# Patient Record
Sex: Female | Born: 1990 | Race: Black or African American | Hispanic: No | Marital: Single | Smoking: Never smoker
Health system: Southern US, Community
[De-identification: ages and names within clinical notes are randomized; demographics above are authoritative.]

## PROBLEM LIST (undated history)

## (undated) ENCOUNTER — Inpatient Hospital Stay (HOSPITAL_COMMUNITY): Payer: Self-pay

## (undated) DIAGNOSIS — K219 Gastro-esophageal reflux disease without esophagitis: Secondary | ICD-10-CM

## (undated) HISTORY — PX: HERNIA REPAIR: SHX51

---
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
## 2012-01-05 DIAGNOSIS — J382 Nodules of vocal cords: Secondary | ICD-10-CM | POA: Insufficient documentation

## 2012-01-31 ENCOUNTER — Encounter (HOSPITAL_COMMUNITY): Payer: Self-pay | Admitting: *Deleted

## 2012-01-31 ENCOUNTER — Emergency Department (HOSPITAL_COMMUNITY)
Admission: EM | Admit: 2012-01-31 | Discharge: 2012-01-31 | Disposition: A | Discharge status: Home / Self Care | Payer: BC Managed Care – PPO | Attending: Emergency Medicine | Admitting: Emergency Medicine

## 2012-01-31 DIAGNOSIS — R109 Unspecified abdominal pain: Secondary | ICD-10-CM | POA: Insufficient documentation

## 2012-01-31 DIAGNOSIS — R197 Diarrhea, unspecified: Secondary | ICD-10-CM | POA: Insufficient documentation

## 2012-01-31 DIAGNOSIS — R112 Nausea with vomiting, unspecified: Secondary | ICD-10-CM | POA: Insufficient documentation

## 2012-01-31 LAB — POCT I-STAT, CHEM 8
Calcium, Ion: 1.14 mmol/L (ref 1.12–1.32)
Chloride: 108 mEq/L (ref 96–112)
Creatinine, Ser: 1 mg/dL (ref 0.50–1.10)
Glucose, Bld: 124 mg/dL — ABNORMAL HIGH (ref 70–99)
HCT: 45 % (ref 36.0–46.0)
Hemoglobin: 15.3 g/dL — ABNORMAL HIGH (ref 12.0–15.0)

## 2012-01-31 LAB — URINE MICROSCOPIC-ADD ON

## 2012-01-31 LAB — URINALYSIS, ROUTINE W REFLEX MICROSCOPIC
Bilirubin Urine: NEGATIVE
Ketones, ur: NEGATIVE mg/dL
Nitrite: NEGATIVE
Urobilinogen, UA: 0.2 mg/dL (ref 0.0–1.0)

## 2012-01-31 LAB — PREGNANCY, URINE: Preg Test, Ur: NEGATIVE

## 2012-01-31 MED ORDER — SODIUM CHLORIDE 0.9 % IV SOLN
1000.0000 mL | Freq: Once | INTRAVENOUS | Status: AC
  Administered 2012-01-31: 1000 mL via INTRAVENOUS

## 2012-01-31 MED ORDER — ONDANSETRON HCL 4 MG PO TABS: 4.0000 mg | ORAL_TABLET | Freq: Four times a day (QID) | ORAL | Status: AC

## 2012-01-31 MED ORDER — ONDANSETRON HCL 4 MG/2ML IJ SOLN
4.0000 mg | Freq: Once | INTRAMUSCULAR | Status: AC
  Administered 2012-01-31: 4 mg via INTRAVENOUS
  Filled 2012-01-31: qty 2

## 2012-01-31 MED ORDER — SODIUM CHLORIDE 0.9 % IV BOLUS (SEPSIS)
1000.0000 mL | Freq: Once | INTRAVENOUS | Status: AC
  Administered 2012-01-31: 1000 mL via INTRAVENOUS

## 2012-01-31 NOTE — ED Notes (Signed)
Bed:WA01<BR> Expected date:<BR> Expected time:<BR> Means of arrival:<BR> Comments:<BR> closed

## 2012-01-31 NOTE — ED Provider Notes (Signed)
History     CSN: [id number]  Arrival date & time 01/31/12  0610   First MD Initiated Contact with Patient 01/31/12 [phone]      Chief Complaint  Patient presents with  . Nausea  . Emesis  . Diarrhea    (Consider location/radiation/quality/duration/timing/severity/associated sxs/prior treatment) Patient is a 21 y.o. female presenting with vomiting and diarrhea. The history is provided by the patient.  Emesis  This is a new problem. The current episode started yesterday. The problem occurs 5 to 10 times per day. The problem has not changed since onset.There has been no fever. Associated symptoms include abdominal pain and diarrhea. Pertinent negatives include no chills and no fever. Associated symptoms comments: She started having frequent vomiting with less frequent diarrhea yesterday around 1:00 p.m. She complains of abdominal cramping after vomiting but no other abdominal pain. . Risk factors include ill contacts.  Diarrhea The primary symptoms include abdominal pain, vomiting and diarrhea. Primary symptoms do not include fever or dysuria.  The illness does not include chills.    History reviewed. No pertinent past medical history.  History reviewed. No pertinent past surgical history.  History reviewed. No pertinent family history.  History  Substance Use Topics  . Smoking status: Never Smoker   . Smokeless tobacco: Not on file  . Alcohol Use: No    OB History    Grav Para Term Preterm Abortions TAB SAB Ect Mult Living                  Review of Systems  Constitutional: Negative for fever and chills.  HENT: Negative.   Respiratory: Negative.   Cardiovascular: Negative.   Gastrointestinal: Positive for vomiting, abdominal pain and diarrhea.  Genitourinary: Negative for dysuria and vaginal discharge.  Musculoskeletal: Negative.   Skin: Negative.   Neurological: Negative.     Allergies  Review of patient's allergies indicates no known allergies.  Home  Medications   Current Outpatient Rx  Name Route Sig Dispense Refill  . PANTOPRAZOLE SODIUM 40 MG PO TBEC Oral Take 40 mg by mouth 2 (two) times daily.      BP 99/60  Pulse 132  Temp(Src) 98.3 F (36.8 C) (Oral)  Resp 20  SpO2 99%  LMP 01/09/2012  Physical Exam  Constitutional: She appears well-developed and well-nourished.  HENT:  Head: Normocephalic.  Mouth/Throat: Mucous membranes are dry.  Neck: Normal range of motion. Neck supple.  Cardiovascular: Normal rate and regular rhythm.   Pulmonary/Chest: Effort normal and breath sounds normal.  Abdominal: Soft. Bowel sounds are normal. There is no tenderness. There is no rebound and no guarding.  Musculoskeletal: Normal range of motion.  Neurological: She is alert. No cranial nerve deficit.  Skin: Skin is warm and dry. No rash noted.  Psychiatric: She has a normal mood and affect.    ED Course  Procedures (including critical care time)  Labs Reviewed  POCT I-STAT, CHEM 8 - Abnormal; Notable for the following:    Glucose, Bld 124 (*)    Hemoglobin 15.3 (*)    All other components within normal limits  PREGNANCY, URINE  URINALYSIS, ROUTINE W REFLEX MICROSCOPIC   No results found.   No diagnosis found.    MDM  Tolerating PO fluids. VS improved. Patient received 2 liters fluids and feels better. Patient and family ready for discharge home.        Rodena Medin, PA-C 01/31/12 [phone]

## 2012-01-31 NOTE — Discharge Instructions (Signed)
B.R.A.T. DIET FOR THE NEXT 12 HOURS THEN ADVANCE AS TOLERATED. PUSH FLUIDS IN SMALL AMOUNTS AND USE ZOFRAN FOR NAUSEA AS NEEDED. RETURN HERE WITH ANY HIGH FEVER, SEVERE PAIN, UNCONTROLLED VOMITING OR OTHER CONCERN.   B.R.A.T. Diet Your doctor has recommended the B.R.A.T. diet for you or your child until the condition improves. This is often used to help control diarrhea and vomiting symptoms. If you or your child can tolerate clear liquids, you may have:  Bananas.   Rice.   Applesauce.   Toast (and other simple starches such as crackers, potatoes, noodles).  Be sure to avoid dairy products, meats, and fatty foods until symptoms are better. Fruit juices such as apple, grape, and prune juice can make diarrhea worse. Avoid these. Continue this diet for 2 days or as instructed by your caregiver. Document Released: 11/08/2005 Document Revised: 10/28/2011 Document Reviewed: 04/27/2007 [facility] Patient Information 2012 [place], [place].  Viral Gastroenteritis Viral gastroenteritis is also known as stomach flu. This condition affects the stomach and intestinal tract. It can cause sudden diarrhea and vomiting. The illness typically lasts 3 to 8 days. Most people develop an immune response that eventually gets rid of the virus. While this natural response develops, the virus can make you quite ill. CAUSES  Many different viruses can cause gastroenteritis, such as rotavirus or noroviruses. You can catch one of these viruses by consuming contaminated food or water. You may also catch a virus by sharing utensils or other personal items with an infected person or by touching a contaminated surface. SYMPTOMS  The most common symptoms are diarrhea and vomiting. These problems can cause a severe loss of body fluids (dehydration) and a body salt (electrolyte) imbalance. Other symptoms may include:  Fever.   Headache.   Fatigue.   Abdominal pain.  DIAGNOSIS  Your caregiver can usually diagnose viral  gastroenteritis based on your symptoms and a physical exam. A stool sample may also be taken to test for the presence of viruses or other infections. TREATMENT  This illness typically goes away on its own. Treatments are aimed at rehydration. The most serious cases of viral gastroenteritis involve vomiting so severely that you are not able to keep fluids down. In these cases, fluids must be given through an intravenous line (IV). HOME CARE INSTRUCTIONS   Drink enough fluids to keep your urine clear or pale yellow. Drink small amounts of fluids frequently and increase the amounts as tolerated.   Ask your caregiver for specific rehydration instructions.   Avoid:   Foods high in sugar.   Alcohol.   Carbonated drinks.   Tobacco.   Juice.   Caffeine drinks.   Extremely hot or cold fluids.   Fatty, greasy foods.   Too much intake of anything at one time.   Dairy products until 24 to 48 hours after diarrhea stops.   You may consume probiotics. Probiotics are active cultures of beneficial bacteria. They may lessen the amount and number of diarrheal stools in adults. Probiotics can be found in yogurt with active cultures and in supplements.   Wash your hands well to avoid spreading the virus.   Only take over-the-counter or prescription medicines for pain, discomfort, or fever as directed by your caregiver. Do not give aspirin to children. Antidiarrheal medicines are not recommended.   Ask your caregiver if you should continue to take your regular prescribed and over-the-counter medicines.   Keep all follow-up appointments as directed by your caregiver.  SEEK IMMEDIATE MEDICAL CARE IF:  You are unable to keep fluids down.   You do not urinate at least once every 6 to 8 hours.   You develop shortness of breath.   You notice blood in your stool or vomit. This may look like coffee grounds.   You have abdominal pain that increases or is concentrated in one small area  (localized).   You have persistent vomiting or diarrhea.   You have a fever.   The patient is a child younger than 3 months, and he or she has a fever.   The patient is a child older than 3 months, and he or she has a fever and persistent symptoms.   The patient is a child older than 3 months, and he or she has a fever and symptoms suddenly get worse.   The patient is a baby, and he or she has no tears when crying.  MAKE SURE YOU:   Understand these instructions.   Will watch your condition.   Will get help right away if you are not doing well or get worse.  Document Released: 11/08/2005 Document Revised: 10/28/2011 Document Reviewed: 08/25/2011 [facility] Patient Information 2012 [place], [place].

## 2012-01-31 NOTE — ED Notes (Signed)
Pt c/o n/v/d since 1300 yesterday too many times to count.

## 2012-01-31 NOTE — ED Notes (Signed)
Pt ambulated to restroom to obtain urine specimen independently.

## 2012-02-01 NOTE — ED Provider Notes (Signed)
Medical screening examination/treatment/procedure(s) were performed by non-physician practitioner and as supervising physician I was immediately available for consultation/collaboration.   Vida Roller, MD 02/01/12 [phone]

## 2013-07-07 ENCOUNTER — Inpatient Hospital Stay (HOSPITAL_COMMUNITY)
Admission: AD | Admit: 2013-07-07 | Discharge: 2013-07-07 | Disposition: A | Discharge status: Home / Self Care | Payer: BC Managed Care – PPO | Source: Ambulatory Visit | Attending: Obstetrics & Gynecology | Admitting: Obstetrics & Gynecology

## 2013-07-07 ENCOUNTER — Encounter (HOSPITAL_COMMUNITY): Payer: Self-pay | Admitting: *Deleted

## 2013-07-07 DIAGNOSIS — N921 Excessive and frequent menstruation with irregular cycle: Secondary | ICD-10-CM

## 2013-07-07 DIAGNOSIS — Z3202 Encounter for pregnancy test, result negative: Secondary | ICD-10-CM | POA: Insufficient documentation

## 2013-07-07 DIAGNOSIS — N760 Acute vaginitis: Secondary | ICD-10-CM

## 2013-07-07 DIAGNOSIS — N938 Other specified abnormal uterine and vaginal bleeding: Secondary | ICD-10-CM | POA: Insufficient documentation

## 2013-07-07 DIAGNOSIS — N949 Unspecified condition associated with female genital organs and menstrual cycle: Secondary | ICD-10-CM | POA: Insufficient documentation

## 2013-07-07 DIAGNOSIS — R109 Unspecified abdominal pain: Secondary | ICD-10-CM | POA: Insufficient documentation

## 2013-07-07 DIAGNOSIS — A499 Bacterial infection, unspecified: Secondary | ICD-10-CM

## 2013-07-07 DIAGNOSIS — B9689 Other specified bacterial agents as the cause of diseases classified elsewhere: Secondary | ICD-10-CM | POA: Insufficient documentation

## 2013-07-07 HISTORY — DX: Gastro-esophageal reflux disease without esophagitis: K21.9

## 2013-07-07 LAB — WET PREP, GENITAL
Trich, Wet Prep: NONE SEEN
Yeast Wet Prep HPF POC: NONE SEEN

## 2013-07-07 LAB — URINALYSIS, ROUTINE W REFLEX MICROSCOPIC
Glucose, UA: NEGATIVE mg/dL
Leukocytes, UA: NEGATIVE
Nitrite: NEGATIVE
Protein, ur: NEGATIVE mg/dL

## 2013-07-07 LAB — URINE MICROSCOPIC-ADD ON

## 2013-07-07 LAB — POCT PREGNANCY, URINE: Preg Test, Ur: NEGATIVE

## 2013-07-07 MED ORDER — METRONIDAZOLE 500 MG PO TABS: 500.0000 mg | ORAL_TABLET | Freq: Two times a day (BID) | ORAL | Status: DC

## 2013-07-07 NOTE — MAU Note (Signed)
Pt had her period last week 06/26/13-06/30/13. Pt reports having bright red spotting on and off this week.Pt reports occasional cramping.

## 2013-07-07 NOTE — MAU Provider Note (Signed)
History     CSN: [id number]  Arrival date and time: 07/07/13 1523   First Provider Initiated Contact with Patient 07/07/13 1710      Chief Complaint  Patient presents with  . Vaginal Bleeding   HPI  Margaret Witherspoonis a 22 y.o.female G1P0010 who presents to MAU with irregular vaginal bleeding and mild bilateral lower abdominal pain.  Her LMP was June 26, 2013 and lasted 6 days, and was a "normal cycle". She has had vaginal spotting on and off throughout this week. The spotting is brown/red in color, scant/small amount and does not require her to wear a pad. She notices the spotting most when she wipes after urination. She has a new sexual partner and does not use contraception or condoms. She denies odor.   OB History   Grav Para Term Preterm Abortions TAB SAB Ect Mult Living   1 0   1 1    0      Past Medical History  Diagnosis Date  . GERD (gastroesophageal reflux disease)     History reviewed. No pertinent past surgical history.  No family history on file.  History  Substance Use Topics  . Smoking status: Never Smoker   . Smokeless tobacco: Not on file  . Alcohol Use: No    Allergies: No Known Allergies  Prescriptions prior to admission  Medication Sig Dispense Refill  . pantoprazole (PROTONIX) 40 MG tablet Take 40 mg by mouth 2 (two) times daily.       Results for orders placed during the hospital encounter of 07/07/13 (from the past 24 hour(s))  URINALYSIS, ROUTINE W REFLEX MICROSCOPIC     Status: Abnormal   Collection Time    07/07/13  4:00 PM      Result Value Range   Color, Urine YELLOW  YELLOW   APPearance CLEAR  CLEAR   Specific Gravity, Urine >1.030 (*) 1.005 - 1.030   pH 6.0  5.0 - 8.0   Glucose, UA NEGATIVE  NEGATIVE mg/dL   Hgb urine dipstick MODERATE (*) NEGATIVE   Bilirubin Urine NEGATIVE  NEGATIVE   Ketones, ur 15 (*) NEGATIVE mg/dL   Protein, ur NEGATIVE  NEGATIVE mg/dL   Urobilinogen, UA 0.2  0.0 - 1.0 mg/dL   Nitrite NEGATIVE   NEGATIVE   Leukocytes, UA NEGATIVE  NEGATIVE  URINE MICROSCOPIC-ADD ON     Status: Abnormal   Collection Time    07/07/13  4:00 PM      Result Value Range   Squamous Epithelial / LPF FEW (*) RARE   WBC, UA 7-10  <3 WBC/hpf   RBC / HPF 7-10  <3 RBC/hpf   Bacteria, UA RARE  RARE   Urine-Other MUCOUS PRESENT    POCT PREGNANCY, URINE     Status: None   Collection Time    07/07/13  4:05 PM      Result Value Range   Preg Test, Ur NEGATIVE  NEGATIVE  WET PREP, GENITAL     Status: Abnormal   Collection Time    07/07/13  5:26 PM      Result Value Range   Yeast Wet Prep HPF POC NONE SEEN  NONE SEEN   Trich, Wet Prep NONE SEEN  NONE SEEN   Clue Cells Wet Prep HPF POC FEW (*) NONE SEEN   WBC, Wet Prep HPF POC MANY (*) NONE SEEN    Review of Systems  Constitutional: Negative for fever and chills.  Gastrointestinal: Positive for abdominal pain. Negative for  nausea, vomiting and constipation.       Bilateral, lower abdominal pain  BM yesterday, normal    Genitourinary: Negative for dysuria and urgency.       No vaginal discharge. Scant vaginal bleeding. No dysuria.   Neurological: Negative for dizziness and headaches.  Psychiatric/Behavioral: The patient is not nervous/anxious.    Physical Exam   Blood pressure 104/71, pulse 99, temperature 97.9 F (36.6 C), temperature source Oral, resp. rate 18, height 5\' 9"  (1.753 m), weight 80.015 kg (176 lb 6.4 oz), last menstrual period 06/26/2013.  Physical Exam  Constitutional: She is oriented to person, place, and time. She appears well-developed and well-nourished. No distress.  GI: She exhibits no distension. There is no tenderness. There is no rebound, no guarding and no CVA tenderness.  Genitourinary:  Speculum exam: Vagina - Small amount of brown/pink discharge, no odor Cervix - No contact bleeding Bimanual exam: Cervix closed Uterus non tender, normal size Adnexa non tender, no masses bilaterally GC/Chlam, wet prep  done Chaperone present for exam.   Neurological: She is alert and oriented to person, place, and time.      MAU Course  Procedures  MDM Wet prep GC/Clamydia pending  UA Urine pregnancy test negative   Assessment and Plan  A:  Bacterial Vaginosis  Spotting between menstrual cycle likely related to BV  P: Flagyl 500 mg BID times 7 days (#14) No RF. No alcohol  A list of local providers has been provided in discharge instructions.; call to schedule for contraception planning Ok to take over the counter ibuprofen for abdominal cramping; use as directed on the bottle.  Follow up in MAU if symptoms worsen and as needed  [facility], Zeke Aker IRENEFNP-C 07/07/2013, 5:11 PM

## 2013-07-07 NOTE — MAU Note (Signed)
Pt reports spotting for two days this week. Pt states that her period stopped on Saturday, but has had this spotting off and on for the last couple of days. Pt reports that she is having some mild cramping.

## 2013-07-09 LAB — GC/CHLAMYDIA PROBE AMP
CT Probe RNA: POSITIVE — AB
GC Probe RNA: NEGATIVE

## 2014-05-07 HISTORY — PX: COLPOSCOPY: SHX161

## 2014-06-05 LAB — OB RESULTS CONSOLE GBS: STREP GROUP B AG: POSITIVE

## 2014-06-19 LAB — OB RESULTS CONSOLE HIV ANTIBODY (ROUTINE TESTING): HIV: NONREACTIVE

## 2014-09-23 ENCOUNTER — Encounter (HOSPITAL_COMMUNITY): Payer: Self-pay | Admitting: *Deleted

## 2014-10-05 LAB — OB RESULTS CONSOLE RPR: RPR: NONREACTIVE

## 2014-11-21 ENCOUNTER — Inpatient Hospital Stay (HOSPITAL_COMMUNITY)
Admission: AD | Admit: 2014-11-21 | Discharge: 2014-11-24 | DRG: 775 | Disposition: A | Discharge status: Home / Self Care | Payer: Medicaid Other | Source: Ambulatory Visit | Attending: Obstetrics and Gynecology | Admitting: Obstetrics and Gynecology

## 2014-11-21 ENCOUNTER — Inpatient Hospital Stay (HOSPITAL_COMMUNITY): Payer: Medicaid Other

## 2014-11-21 ENCOUNTER — Encounter (HOSPITAL_COMMUNITY): Payer: Self-pay | Admitting: *Deleted

## 2014-11-21 DIAGNOSIS — Z3A32 32 weeks gestation of pregnancy: Secondary | ICD-10-CM | POA: Diagnosis present

## 2014-11-21 DIAGNOSIS — O368190 Decreased fetal movements, unspecified trimester, not applicable or unspecified: Secondary | ICD-10-CM | POA: Insufficient documentation

## 2014-11-21 DIAGNOSIS — O368131 Decreased fetal movements, third trimester, fetus 1: Secondary | ICD-10-CM

## 2014-11-21 DIAGNOSIS — K219 Gastro-esophageal reflux disease without esophagitis: Secondary | ICD-10-CM | POA: Diagnosis present

## 2014-11-21 DIAGNOSIS — O9962 Diseases of the digestive system complicating childbirth: Secondary | ICD-10-CM | POA: Diagnosis present

## 2014-11-21 DIAGNOSIS — O364XX0 Maternal care for intrauterine death, not applicable or unspecified: Secondary | ICD-10-CM | POA: Diagnosis present

## 2014-11-21 DIAGNOSIS — O99824 Streptococcus B carrier state complicating childbirth: Secondary | ICD-10-CM | POA: Diagnosis present

## 2014-11-21 DIAGNOSIS — O368130 Decreased fetal movements, third trimester, not applicable or unspecified: Secondary | ICD-10-CM | POA: Diagnosis present

## 2014-11-21 DIAGNOSIS — IMO0002 Reserved for concepts with insufficient information to code with codable children: Secondary | ICD-10-CM | POA: Diagnosis present

## 2014-11-21 LAB — COMPREHENSIVE METABOLIC PANEL
ALT: 17 U/L (ref 0–35)
ANION GAP: 8 (ref 5–15)
AST: 17 U/L (ref 0–37)
Albumin: 3.2 g/dL — ABNORMAL LOW (ref 3.5–5.2)
Alkaline Phosphatase: 100 U/L (ref 39–117)
BUN: 6 mg/dL (ref 6–23)
CHLORIDE: 106 meq/L (ref 96–112)
CO2: 23 mmol/L (ref 19–32)
Calcium: 9.1 mg/dL (ref 8.4–10.5)
Creatinine, Ser: 0.4 mg/dL — ABNORMAL LOW (ref 0.50–1.10)
GFR calc Af Amer: >90 mL/min (ref >90)
GFR calc non Af Amer: >90 mL/min (ref >90)
GLUCOSE: 93 mg/dL (ref 70–99)
Potassium: 3.9 mmol/L (ref 3.5–5.1)
Sodium: 137 mmol/L (ref 135–145)
Total Bilirubin: 0.4 mg/dL (ref 0.3–1.2)
Total Protein: 6.6 g/dL (ref 6.0–8.3)

## 2014-11-21 LAB — OB RESULTS CONSOLE ABO/RH: RH TYPE: POSITIVE

## 2014-11-21 LAB — TYPE AND SCREEN
ABO/RH(D): B POS
ANTIBODY SCREEN: NEGATIVE

## 2014-11-21 LAB — CBC
HCT: 35.4 % — ABNORMAL LOW (ref 36.0–46.0)
HEMOGLOBIN: 12.2 g/dL (ref 12.0–15.0)
MCH: 32.3 pg (ref 26.0–34.0)
MCHC: 34.5 g/dL (ref 30.0–36.0)
MCV: 93.7 fL (ref 78.0–100.0)
PLATELETS: 256 10*3/uL (ref 150–400)
RBC: 3.78 MIL/uL — ABNORMAL LOW (ref 3.87–5.11)
RDW: 13.2 % (ref 11.5–15.5)
WBC: 12.4 10*3/uL — ABNORMAL HIGH (ref 4.0–10.5)

## 2014-11-21 LAB — OB RESULTS CONSOLE ANTIBODY SCREEN: Antibody Screen: NEGATIVE

## 2014-11-21 LAB — OB RESULTS CONSOLE HEPATITIS B SURFACE ANTIGEN: HEP B S AG: NEGATIVE

## 2014-11-21 LAB — OB RESULTS CONSOLE RUBELLA ANTIBODY, IGM: RUBELLA: IMMUNE

## 2014-11-21 LAB — OB RESULTS CONSOLE RPR: RPR: NONREACTIVE

## 2014-11-21 LAB — TSH: TSH: 2.423 u[IU]/mL (ref 0.350–4.500)

## 2014-11-21 MED ORDER — LACTATED RINGERS IV SOLN
INTRAVENOUS | Status: DC
  Administered 2014-11-21 – 2014-11-22 (×2): via INTRAVENOUS
  Administered 2014-11-22: 125 mL/h via INTRAVENOUS

## 2014-11-21 MED ORDER — OXYCODONE-ACETAMINOPHEN 5-325 MG PO TABS: 1.0000 | ORAL_TABLET | ORAL | Status: DC | PRN

## 2014-11-21 MED ORDER — ONDANSETRON HCL 4 MG/2ML IJ SOLN: 4.0000 mg | Freq: Four times a day (QID) | INTRAMUSCULAR | Status: DC | PRN

## 2014-11-21 MED ORDER — OXYTOCIN BOLUS FROM INFUSION: 500.0000 mL | INTRAVENOUS | Status: DC

## 2014-11-21 MED ORDER — LACTATED RINGERS IV SOLN: 500.0000 mL | INTRAVENOUS | Status: DC | PRN

## 2014-11-21 MED ORDER — OXYTOCIN 40 UNITS IN LACTATED RINGERS INFUSION - SIMPLE MED: 62.5000 mL/h | INTRAVENOUS | Status: DC

## 2014-11-21 MED ORDER — ACETAMINOPHEN 325 MG PO TABS: 650.0000 mg | ORAL_TABLET | ORAL | Status: DC | PRN

## 2014-11-21 MED ORDER — ZOLPIDEM TARTRATE 5 MG PO TABS
5.0000 mg | ORAL_TABLET | Freq: Every evening | ORAL | Status: DC | PRN
Start: 2014-11-21 — End: 2014-11-22
  Administered 2014-11-22: 5 mg via ORAL
  Filled 2014-11-21: qty 1

## 2014-11-21 MED ORDER — MISOPROSTOL 25 MCG QUARTER TABLET
25.0000 ug | ORAL_TABLET | ORAL | Status: DC | PRN
  Administered 2014-11-21 – 2014-11-22 (×2): 25 ug via VAGINAL
  Filled 2014-11-21 (×2): qty 0.25

## 2014-11-21 MED ORDER — OXYCODONE-ACETAMINOPHEN 5-325 MG PO TABS: 2.0000 | ORAL_TABLET | ORAL | Status: DC | PRN

## 2014-11-21 MED ORDER — NALBUPHINE HCL 10 MG/ML IJ SOLN: 5.0000 mg | INTRAMUSCULAR | Status: DC | PRN

## 2014-11-21 MED ORDER — BUTORPHANOL TARTRATE 1 MG/ML IJ SOLN
1.0000 mg | INTRAMUSCULAR | Status: DC | PRN
Start: 2014-11-21 — End: 2014-11-22
  Administered 2014-11-22: 1 mg via INTRAVENOUS
  Filled 2014-11-21: qty 1

## 2014-11-21 MED ORDER — OXYTOCIN 10 UNIT/ML IJ SOLN: 10.0000 [IU] | Freq: Once | INTRAMUSCULAR | Status: DC

## 2014-11-21 MED ORDER — TERBUTALINE SULFATE 1 MG/ML IJ SOLN: 0.2500 mg | Freq: Once | INTRAMUSCULAR | Status: AC | PRN

## 2014-11-21 MED ORDER — LIDOCAINE HCL (PF) 1 % IJ SOLN
30.0000 mL | INTRAMUSCULAR | Status: DC | PRN
  Filled 2014-11-21: qty 30

## 2014-11-21 MED ORDER — CITRIC ACID-SODIUM CITRATE 334-500 MG/5ML PO SOLN: 30.0000 mL | ORAL | Status: DC | PRN

## 2014-11-21 NOTE — MAU Note (Signed)
C/o no fetal movement today; states that she felt fetal movemnet last night;

## 2014-11-21 NOTE — H&P (Signed)
Margaret Russell is a 23 y.o. female presenting for evaluation due to decreased FM and was found to have IUFD. PNC complicated by (+) chlamydia for which pt was treated. Pt denies any substance use. Patient had note decreased fetal movement since yesterday. (-) vaginal bleeding.  Maternal Medical History:  Fetal activity: Perceived fetal activity is none.    Prenatal complications: no prenatal complications   OB History    Gravida Para Term Preterm AB TAB SAB Ectopic Multiple Living   2    1 1          Past Medical History  Diagnosis Date  . GERD (gastroesophageal reflux disease)   surgery: D&E  History reviewed. No pertinent past surgical history. Family History: family history is not on file. Social History:  reports that she has never smoked. She does not have any smokeless tobacco history on file. She reports that she does not drink alcohol or use illicit drugs.   Prenatal Transfer Tool  Maternal Diabetes: No Genetic Screening: Declined Maternal Ultrasounds/Referrals: Normal Fetal Ultrasounds or other Referrals:  None Maternal Substance Abuse:  No Significant Maternal Medications:  None Significant Maternal Lab Results:  Lab values include: Group B Strep positive Other Comments:  unexplained fetal demise  Review of Systems  All other systems reviewed and are negative.     Blood pressure 112/74, pulse 83, SpO2 100 %. Exam Physical Exam  Constitutional: She is oriented to person, place, and time. She appears well-developed and well-nourished.  HENT:  Head: Atraumatic.  Eyes: EOM are normal.  Neck: Neck supple.  Cardiovascular: Regular rhythm.   Respiratory: Breath sounds normal.  GI: Bowel sounds are normal.  Musculoskeletal: She exhibits no edema.  Neurological: She is alert and oriented to person, place, and time.  Skin: Skin is warm and dry.  Psychiatric: She has a normal mood and affect.   VE' Ft external os/thick/OOP vtx  Prenatal labs: ABO, Rh:   b  positive Antibody:  neg Rubella:  Immune RPR:   NR HBsAg:  neg  HIV:   nR GBS:   positive  Assessment/Plan: IUFD @ 32 1/7 weeks  P) admit . Routine labs.  Antiphospholipid panel, TSH, Hgb A1c., CMP,. cytotec q 4 hrs. Epidural/analgesic prn  Given fetal demise, No need for GBS prophylaxis antibiotics. Declines urine tox screen( not suspecting as cause) Annahi Short A 11/21/2014, 6:48 PM

## 2014-11-21 NOTE — MAU Provider Note (Signed)
  History     CSN: [id number]  Arrival date and time: 11/21/14 1648 Provider notified of pt arrival @1705 [facility] @1706    HPI Comments: G2P0010 @32 .1 weeks c/o no FM since last night. No ctx, pain, or VB. No tobacco, alcohol, or DA. No recent trauma. Pregnancy complicated by +chlamydia, GBS bacteruria, and excess weight gain.   OB History    Gravida Para Term Preterm AB TAB SAB Ectopic Multiple Living   1    1 1           Past Medical History  Diagnosis Date  . GERD (gastroesophageal reflux disease)     No past surgical history on file.  No family history on file.  History  Substance Use Topics  . Smoking status: Never Smoker   . Smokeless tobacco: Not on file  . Alcohol Use: No    Allergies: No Known Allergies  Prescriptions prior to admission  Medication Sig Dispense Refill Last Dose  . metroNIDAZOLE (FLAGYL) 500 MG tablet Take 1 tablet (500 mg total) by mouth 2 (two) times daily. 14 tablet 0   . pantoprazole (PROTONIX) 40 MG tablet Take 40 mg by mouth 2 (two) times daily.   Past Week at Unknown    Review of Systems  Constitutional: Negative.   HENT: Negative.   Eyes: Negative.   Respiratory: Negative.   Cardiovascular: Negative.   Gastrointestinal: Negative.   Genitourinary: Negative.   Musculoskeletal: Negative.   Skin: Negative.   Neurological: Negative.   Endo/Heme/Allergies: Negative.   Psychiatric/Behavioral: Negative.    Physical Exam   BP: 112/74, HR 83, RR 20  Physical Exam  Constitutional: She is oriented to person, place, and time. She appears well-developed and well-nourished.  HENT:  Head: Normocephalic and atraumatic.  Neck: Normal range of motion. Neck supple.  Cardiovascular: Normal rate.   Respiratory: Effort normal.  GI: Soft.  gravid  Musculoskeletal: Normal range of motion.  Neurological: She is alert and oriented to person, place, and time.  Skin: Skin is warm and dry.  Psychiatric: She has a  normal mood and affect.  FHT: not audible  MAU Course  Procedures Bedside sono: vtx, no cardiac activity  MDM n/a  Assessment and Plan  32.[redacted] weeks gestation IUFD Dr. Cherly Hensenousins notified of assessment, MD in route  [facility] IncBHAMBRI, MELANIE, N 11/21/2014, 5:01 PM

## 2014-11-22 ENCOUNTER — Encounter (HOSPITAL_COMMUNITY): Payer: Self-pay | Admitting: Anesthesiology

## 2014-11-22 ENCOUNTER — Inpatient Hospital Stay (HOSPITAL_COMMUNITY): Payer: Medicaid Other | Admitting: Anesthesiology

## 2014-11-22 LAB — PLATELET COUNT
PLATELETS: 212 10*3/uL (ref 150–400)
Platelets: 217 10*3/uL (ref 150–400)

## 2014-11-22 LAB — FIBRINOGEN
FIBRINOGEN: 556 mg/dL — AB (ref 204–475)
Fibrinogen: 506 mg/dL — ABNORMAL HIGH (ref 204–475)

## 2014-11-22 LAB — ABO/RH: ABO/RH(D): B POS

## 2014-11-22 LAB — PROTIME-INR
INR: 1.07 (ref 0.00–1.49)
INR: 1.06 (ref 0.00–1.49)
PROTHROMBIN TIME: 13.9 s (ref 11.6–15.2)
Prothrombin Time: 14 seconds (ref 11.6–15.2)

## 2014-11-22 LAB — APTT
APTT: 28 s (ref 24–37)
aPTT: 28 seconds (ref 24–37)

## 2014-11-22 LAB — SAVE SMEAR

## 2014-11-22 MED ORDER — PHENYLEPHRINE 40 MCG/ML (10ML) SYRINGE FOR IV PUSH (FOR BLOOD PRESSURE SUPPORT)
80.0000 ug | PREFILLED_SYRINGE | INTRAVENOUS | Status: DC | PRN
  Filled 2014-11-22: qty 10
  Filled 2014-11-22: qty 2

## 2014-11-22 MED ORDER — EPHEDRINE 5 MG/ML INJ
10.0000 mg | INTRAVENOUS | Status: DC | PRN
  Filled 2014-11-22: qty 2

## 2014-11-22 MED ORDER — DIPHENHYDRAMINE HCL 50 MG/ML IJ SOLN: 12.5000 mg | INTRAMUSCULAR | Status: DC | PRN

## 2014-11-22 MED ORDER — BENZOCAINE-MENTHOL 20-0.5 % EX AERO
1.0000 "application " | INHALATION_SPRAY | CUTANEOUS | Status: DC | PRN
  Administered 2014-11-23: 1 via TOPICAL
  Filled 2014-11-22: qty 56

## 2014-11-22 MED ORDER — DIBUCAINE 1 % RE OINT: 1.0000 "application " | TOPICAL_OINTMENT | RECTAL | Status: DC | PRN

## 2014-11-22 MED ORDER — OXYCODONE-ACETAMINOPHEN 5-325 MG PO TABS: 1.0000 | ORAL_TABLET | ORAL | Status: DC | PRN

## 2014-11-22 MED ORDER — WITCH HAZEL-GLYCERIN EX PADS: 1.0000 "application " | MEDICATED_PAD | CUTANEOUS | Status: DC | PRN

## 2014-11-22 MED ORDER — FENTANYL 2.5 MCG/ML BUPIVACAINE 1/10 % EPIDURAL INFUSION (WH - ANES)
14.0000 mL/h | INTRAMUSCULAR | Status: DC | PRN
  Administered 2014-11-22: 14 mL/h via EPIDURAL
  Filled 2014-11-22: qty 125

## 2014-11-22 MED ORDER — ZOLPIDEM TARTRATE 5 MG PO TABS
5.0000 mg | ORAL_TABLET | Freq: Every evening | ORAL | Status: DC | PRN
  Administered 2014-11-23 – 2014-11-24 (×2): 5 mg via ORAL
  Filled 2014-11-22 (×2): qty 1

## 2014-11-22 MED ORDER — OXYCODONE-ACETAMINOPHEN 5-325 MG PO TABS
2.0000 | ORAL_TABLET | ORAL | Status: DC | PRN
  Administered 2014-11-23 (×2): 2 via ORAL
  Administered 2014-11-24: 1 via ORAL
  Filled 2014-11-22 (×3): qty 2

## 2014-11-22 MED ORDER — PHENYLEPHRINE 40 MCG/ML (10ML) SYRINGE FOR IV PUSH (FOR BLOOD PRESSURE SUPPORT)
80.0000 ug | PREFILLED_SYRINGE | INTRAVENOUS | Status: DC | PRN
  Filled 2014-11-22: qty 2
  Filled 2014-11-22: qty 10

## 2014-11-22 MED ORDER — DIPHENHYDRAMINE HCL 25 MG PO CAPS: 25.0000 mg | ORAL_CAPSULE | Freq: Four times a day (QID) | ORAL | Status: DC | PRN

## 2014-11-22 MED ORDER — SIMETHICONE 80 MG PO CHEW: 80.0000 mg | CHEWABLE_TABLET | ORAL | Status: DC | PRN

## 2014-11-22 MED ORDER — IBUPROFEN 600 MG PO TABS
600.0000 mg | ORAL_TABLET | Freq: Four times a day (QID) | ORAL | Status: DC
  Administered 2014-11-22 – 2014-11-24 (×6): 600 mg via ORAL
  Filled 2014-11-22 (×6): qty 1

## 2014-11-22 MED ORDER — OXYTOCIN 40 UNITS IN LACTATED RINGERS INFUSION - SIMPLE MED
1.0000 m[IU]/min | INTRAVENOUS | Status: DC
Start: 2014-11-22 — End: 2014-11-22
  Administered 2014-11-22: 2 m[IU]/min via INTRAVENOUS
  Administered 2014-11-22: 6 m[IU]/min via INTRAVENOUS
  Filled 2014-11-22: qty 1000

## 2014-11-22 MED ORDER — ONDANSETRON HCL 4 MG PO TABS: 4.0000 mg | ORAL_TABLET | ORAL | Status: DC | PRN

## 2014-11-22 MED ORDER — LACTATED RINGERS IV SOLN
500.0000 mL | Freq: Once | INTRAVENOUS | Status: AC
  Administered 2014-11-22: 500 mL via INTRAVENOUS

## 2014-11-22 MED ORDER — FENTANYL 2.5 MCG/ML BUPIVACAINE 1/10 % EPIDURAL INFUSION (WH - ANES)
INTRAMUSCULAR | Status: DC | PRN
  Administered 2014-11-22: 14 mL/h via EPIDURAL

## 2014-11-22 MED ORDER — PRENATAL MULTIVITAMIN CH
1.0000 | ORAL_TABLET | Freq: Every day | ORAL | Status: DC
  Administered 2014-11-23: 1 via ORAL
  Filled 2014-11-22: qty 1

## 2014-11-22 MED ORDER — LANOLIN HYDROUS EX OINT: TOPICAL_OINTMENT | CUTANEOUS | Status: DC | PRN

## 2014-11-22 MED ORDER — FERROUS SULFATE 325 (65 FE) MG PO TABS
325.0000 mg | ORAL_TABLET | Freq: Two times a day (BID) | ORAL | Status: DC
  Administered 2014-11-23: 325 mg via ORAL
  Filled 2014-11-22 (×2): qty 1

## 2014-11-22 MED ORDER — LIDOCAINE HCL (PF) 1 % IJ SOLN
INTRAMUSCULAR | Status: DC | PRN
  Administered 2014-11-22 (×2): 4 mL

## 2014-11-22 MED ORDER — ONDANSETRON HCL 4 MG/2ML IJ SOLN: 4.0000 mg | INTRAMUSCULAR | Status: DC | PRN

## 2014-11-22 MED ORDER — SENNOSIDES-DOCUSATE SODIUM 8.6-50 MG PO TABS
2.0000 | ORAL_TABLET | ORAL | Status: DC
  Administered 2014-11-22 – 2014-11-23 (×2): 2 via ORAL
  Filled 2014-11-22 (×2): qty 2

## 2014-11-22 NOTE — Anesthesia Procedure Notes (Signed)
Epidural Patient location during procedure: OB Start time: 11/22/2014 6:53 AM  Staffing Anesthesiologist: Tymarion Everard A. Performed by: anesthesiologist   Preanesthetic Checklist Completed: patient identified, site marked, surgical consent, pre-op evaluation, timeout performed, IV checked, risks and benefits discussed and monitors and equipment checked  Epidural Patient position: sitting Prep: site prepped and draped and DuraPrep Patient monitoring: continuous pulse ox and blood pressure Approach: midline Location: L3-L4 Injection technique: LOR air  Needle:  Needle type: Tuohy  Needle gauge: 17 G Needle length: 9 cm and 9 Needle insertion depth: 5 cm cm Catheter type: closed end flexible Catheter size: 19 Gauge Catheter at skin depth: 10 cm Test dose: negative and Other  Assessment Events: blood not aspirated, injection not painful, no injection resistance, negative IV test and no paresthesia  Additional Notes Patient identified. Risks and benefits discussed including failed block, incomplete  Pain control, post dural puncture headache, nerve damage, paralysis, blood pressure Changes, nausea, vomiting, reactions to medications-both toxic and allergic and post Partum back pain. All questions were answered. Patient expressed understanding and wished to proceed. Sterile technique was used throughout procedure. Epidural site was Dressed with sterile barrier dressing. No paresthesias, signs of intravascular injection Or signs of intrathecal spread were encountered.  Patient was more comfortable after the epidural was dosed. Please see RN's note for documentation of vital signs and FHR which are stable.

## 2014-11-22 NOTE — Progress Notes (Signed)
S: Epidural  O:  Pitocin 18 miu T98.9 BP 101/50 foley in vagina removed VE: 4.5/60/-3 bulging membrane  AROm clear fluid vtx asynclytic IUPC placed  Tracing: dysfunctional ctx pattern  IMP: IUFD GBS cx (+) P) right exaggerated sims. Cont pitocin

## 2014-11-22 NOTE — L&D Delivery Note (Signed)
Delivery Note At  a non-viable female infant was delivered via Vaginal, Spontaneous Delivery (Presentation: Right Occiput Anterior).  APGAR: 0, 0; weight  pending .   Placenta status: Intact, Spontaneous Pathology.  Cord:  with the following complications: Long. Loop of cord next to chest  Cord pH: n/a Baby  Was very pale/white( appear devoid of blood). Declines autopsy. Tissue sent for chromosomal study Anesthesia: Epidural  Episiotomy: None Lacerations: None Suture Repair: n/a Est. Blood Loss (mL): 250  Mom to postpartum.  Baby to [place].  Fredick Schlosser A 11/22/2014, 2:26 PM

## 2014-11-22 NOTE — Progress Notes (Signed)
Discussed with patient care of the baby after delivery. Explained process of pictures, holding and spending time with baby. Patient verbalizes understanding. Emotional support offered.

## 2014-11-22 NOTE — Anesthesia Preprocedure Evaluation (Signed)
Anesthesia Evaluation  Patient identified by MRN, date of birth, ID band Patient awake    Reviewed: Allergy & Precautions, H&P , Patient's Chart, lab work & pertinent test results  Airway Mallampati: III  TM Distance: >3 FB Neck ROM: Full    Dental no notable dental hx. (+) Teeth Intact   Pulmonary neg pulmonary ROS,  breath sounds clear to auscultation  Pulmonary exam normal       Cardiovascular negative cardio ROS  Rhythm:Regular Rate:Normal     Neuro/Psych negative neurological ROS  negative psych ROS   GI/Hepatic Neg liver ROS, GERD-  ,  Endo/Other  negative endocrine ROSObesity  Renal/GU negative Renal ROS  negative genitourinary   Musculoskeletal negative musculoskeletal ROS (+)   Abdominal (+) + obese,   Peds  Hematology   Anesthesia Other Findings   Reproductive/Obstetrics (+) Pregnancy IUFD at 32 2/7 weeks                             Anesthesia Physical Anesthesia Plan  ASA: II  Anesthesia Plan: Epidural   Post-op Pain Management:    Induction:   Airway Management Planned: Natural Airway  Additional Equipment:   Intra-op Plan:   Post-operative Plan:   Informed Consent: I have reviewed the patients History and Physical, chart, labs and discussed the procedure including the risks, benefits and alternatives for the proposed anesthesia with the patient or authorized representative who has indicated his/her understanding and acceptance.     Plan Discussed with: Anesthesiologist  Anesthesia Plan Comments:         Anesthesia Quick Evaluation

## 2014-11-22 NOTE — Progress Notes (Signed)
Chaplain paged to assist Ms Capito consider options for funeral and cremation. Chaplain also dealt with the onset of Ms Sudbeck's grief. Grief counsel and comfort given. Ms Raigoza chose a funeral home and that facility will carry out the family's wishes concerning the care of son Marlinda Mike' remains. Please page the chaplain should Ms Munce desire or require further spiritual or emotional support.  Benjie Karvonen. Javian Nudd, DMin, MDiv, MA Chaplain

## 2014-11-22 NOTE — Progress Notes (Signed)
Margaret Russell is a 24 y.o. G2P0010 at [redacted]w[redacted]d by ultrasound admitted for induction of labor due to IUFD  Subjective: Chief Complaint  Patient presents with  . Decreased Fetal Movement   S/p cytotec x 2  Notes min pain Objective: BP 116/68 mmHg  Pulse 77  Temp(Src) 98.3 F (36.8 C)  Resp 18  Ht  (1.727 m)  Wt 97.977 kg (216 lb)  BMI 32.85 kg/m2  SpO2 100%       UC:   Not  monitored SVE:  1 thick.-3 /-4 vtx Labs: Lab Results  Component Value Date   WBC 12.4* 11/21/2014   HGB 12.2 11/21/2014   HCT 35.4* 11/21/2014   MCV 93.7 11/21/2014   PLT 256 11/21/2014    Assessment / Plan: IUFD IUP @ 32 2/7 weeks P) intracervical balloon. Start pitocin. Analgesic. Exaggerated left sims position  Anticipated MOD:  NSVD  Jemmie Ledgerwood A 11/22/2014, 3:42 AM

## 2014-11-22 NOTE — Progress Notes (Signed)
Pt had experienced fetal loss (within 15 min) of my arrival. She was very tearful. Mom was bedside and holding pt's hand w/pt's uncle bedside. Pt's brother was holding baby w/his wife in the room. Nurse and dr were in the room. Chaplain was able to speak w/pt and offer words of comfort that she and her mom seemed to appreciate. She opened her eyes and within a few minutes asked to hold her baby. With family around her she asked for pictures with her holding the baby. Explained to mom options for services and final arrangements and told her whenever they are ready to talk about that further to please have the nurse to page the Chaplain. Also spoke w/nurse who can page Chaplain when they are ready to discuss any arrangements. Will update Chaplain Lumpkin of any pending calls re this pt. Pt and family were tearful, but very pleasant to work with. Staff and Dr. Cherly Hensen were very supportive and helpful throughout the event of their loss. Marjory Lies Chaplain   11/22/14 1500  Clinical Encounter Type  Visited With Patient and family together

## 2014-11-23 DIAGNOSIS — O368190 Decreased fetal movements, unspecified trimester, not applicable or unspecified: Secondary | ICD-10-CM | POA: Insufficient documentation

## 2014-11-23 DIAGNOSIS — Z3A32 32 weeks gestation of pregnancy: Secondary | ICD-10-CM | POA: Insufficient documentation

## 2014-11-23 LAB — CBC
HCT: 33.3 % — ABNORMAL LOW (ref 36.0–46.0)
Hemoglobin: 11.7 g/dL — ABNORMAL LOW (ref 12.0–15.0)
MCH: 32.8 pg (ref 26.0–34.0)
MCHC: 35.1 g/dL (ref 30.0–36.0)
MCV: 93.3 fL (ref 78.0–100.0)
Platelets: 206 10*3/uL (ref 150–400)
RBC: 3.57 MIL/uL — AB (ref 3.87–5.11)
RDW: 13.1 % (ref 11.5–15.5)
WBC: 19.5 10*3/uL — ABNORMAL HIGH (ref 4.0–10.5)

## 2014-11-23 NOTE — Progress Notes (Signed)
PPD 1 SVD  S:  Reports feeling "ok"             Tolerating po/ No nausea or vomiting             Bleeding is light             Pain controlled with motrin and percocet as needed             Up ad lib / ambulatory / voiding QS             Melynda Ripple - arrangements completed for cremation             FOB traveling today to come visit with baby "Margaret Russell" prior to funeral home release  O:               VS: BP 121/76 mmHg  Pulse 96  Temp(Src) 97.8 F (36.6 C) (Oral)  Resp 18  Ht  (1.727 m)  Wt 97.977 kg (216 lb)  BMI 32.85 kg/m2  SpO2 92%    Physical Exam:             Alert and oriented X3 - sad with appropriate grief reaction when talking about son  Abdomen: soft, non-tender, non-distended              Fundus: firm, non-tender, U-2  Perineum: no edema  Lochia: light   Extremities: no edema, no calf pain or tenderness    A: PPD # 2 SVD after IUFD @ 32 weeks  Doing well with appropriate grief response  P: Routine post partum orders / [facility] referral / Grief counseling recommended             Anticipate DC tomorrow - desires to stay tonight to be with baby / FOB wants to see and hold baby   Margaret Russell CNM, MSN, [facility] 11/23/2014, 11:28 AM

## 2014-11-24 LAB — RPR

## 2014-11-24 LAB — HEMOGLOBIN A1C
Hgb A1c MFr Bld: 5.2 % (ref <5.7)
Mean Plasma Glucose: 103 mg/dL (ref <117)

## 2014-11-24 MED ORDER — IBUPROFEN 600 MG PO TABS: 600.0000 mg | ORAL_TABLET | Freq: Four times a day (QID) | ORAL | Status: DC

## 2014-11-24 MED ORDER — SERTRALINE HCL 50 MG PO TABS: 50.0000 mg | ORAL_TABLET | Freq: Every day | ORAL | Status: DC

## 2014-11-24 MED ORDER — OXYCODONE-ACETAMINOPHEN 5-325 MG PO TABS: 1.0000 | ORAL_TABLET | ORAL | Status: DC | PRN

## 2014-11-24 MED ORDER — HYDROXYZINE PAMOATE 25 MG PO CAPS: ORAL_CAPSULE | ORAL | Status: DC

## 2014-11-24 NOTE — Progress Notes (Signed)
PPD 2 SVD  S:       Tearful - unable to talk this AM            Family states she did not sleep & not eating much - just crying - concerned about depression            Reports just spotting - no pain            FOB stayed with her last night - visited with baby             "Marlinda Mike" released to funeral home this am            O:               VS: BP 117/61 mmHg  Pulse 111  Temp(Src) 98 F (36.7 C) (Oral)  Resp 18  Ht  (1.727 m)  Wt 97.977 kg (216 lb)  BMI 32.85 kg/m2  SpO2 97%   LABS:              Recent Labs  11/21/14 2020  11/22/14 1520 11/23/14 0526  WBC 12.4*  --   --  19.5*  HGB 12.2  --   --  11.7*  PLT 256  < > 217 206  < > = values in this interval not displayed.             Blood type: --/--/B POS, B POS (12/31 1855)  Rubella: Immune (12/31 0000)                                Physical Exam:             Tearful - sobbing at intervals and extremely sad / family at bedside  Abdomen: soft, non-tender, non-distended              Fundus: firm, non-tender, U-3  Extremities: no edema, no calf pain or tenderness  A: PPD # 2 SVD with IUFD at 32 weeks  Appropriate grief reaction - risk for depression after loss of child  P: Routine post partum orders  DC home             Vistaril for sleep - family to encourage eating and drinking even when she is not hungry             Call if refusing to eat or drink or not sleeping despite Vistaril             Depression very likely with this type of loss                  would recommend starting medication to help with coping                   would start medication this week with peak effect in 2-3 weeks when depression likely to start                   symptoms now of crying is not depression but is a normal                   office visit in next 1-2 weeks to assess grief and coping                  strongly consider grief counselor - pastor - [facility] to help deal with loss of child  Marlinda Mike CNM, MSN,  FACNM 11/24/2014, 11:50 AM

## 2014-11-24 NOTE — Progress Notes (Signed)
D/c instruction and prescriptions reviewed with patient and family. Pt will follow up with MD at the office this coming week advised to call the office 11-25-14 for appt date. No further questions at this time. Pt d/c home stable, with family via wc to private car.

## 2014-11-24 NOTE — Discharge Summary (Signed)
Obstetric Discharge Summary  Reason for Admission: decreased fetal movement for 24 hours - diagnosed with IUFD @ 32 week Prenatal Procedures: ultrasound for confirmation of diagnosis Intrapartum Procedures: Labor induction (cytotec / cervical balloon / pitocin) / Epidural / spontaneous vaginal delivery Postpartum Procedures: none Complications-Operative and Postpartum: none HEMOGLOBIN  Date Value Ref Range Status  11/23/2014 11.7* 12.0 - 15.0 g/dL Final   HCT  Date Value Ref Range Status  11/23/2014 33.3* 36.0 - 46.0 % Final   Physical Exam:  General: alert, cooperative and fatigued and tearful with appropriate sadness Lochia: appropriate Uterine Fundus: firm DVT Evaluation: No evidence of DVT seen on physical exam. Hospital course: pt presented to hospital for evaluation of DFM. Pt was diagnosed with IUFD. Labs for thrombophilic panel, CMV, etc were done. cytotec for cervical ripening followed by intracervical balloon and pitocin was utilized. Pt delivered nonviable female infant who appeared pale, anemic as did the cord. Loop of cord next to chest was noted. Pt and family declined autopsy but agreed to tissue for chromosomal analysis. Pt grieved appropriately. Uncomplicated postpartum course. Placenta sent to path. Discharge Diagnoses: Pre-Term Pregnancy-delivered@ 32 weeks / IUFD / acute grief reaction  Discharge Information: Date: 11/24/2014 Activity: pelvic rest Diet: routine Medications: PNV, Ibuprofen, Percocet and Vistaril for sleep PRN and Zoloft 50 mg Condition: stable Instructions: Call for fever or chills / heavy bleeding / increased pain / Inability to eat or drink or sleep / depression symptoms Discharge to: home Follow-up Information    Follow up with Bryce Kimble A, MD. Go in 2 weeks.   Specialty:  Obstetrics and Gynecology   Why:  office will call you for an appointment date and time   Contact information:   [address] Alvira Philips [facility] [address] [phone]      Newborn Data: Live born female  Birth Weight: 3 lb 13.9 oz (1755 g) APGAR: 0, 0  Transportation to funeral home - Kimber Relic 11/24/2014, 12:05 PM

## 2014-11-25 ENCOUNTER — Encounter (HOSPITAL_COMMUNITY): Payer: Self-pay | Admitting: Anesthesiology

## 2014-11-25 LAB — CMV IGM

## 2014-11-25 LAB — CMV ANTIBODY, IGG (EIA): CMV AB - IGG: 2.9 U/mL — AB (ref <0.60)

## 2014-11-25 LAB — TOXOPLASMA GONDII ANTIBODY, IGM

## 2014-11-25 NOTE — Progress Notes (Signed)
Ur chart review completed post discharge.  

## 2014-11-25 NOTE — Anesthesia Postprocedure Evaluation (Signed)
Anesthesia Post Note  Patient: Margaret Russell, Margaret Russell  Procedure(s) Performed: * No procedures listed *  Anesthesia type: Epidural  Patient location: Mother/Baby  Post pain: Pain level controlled  Post assessment: Post-op Vital signs reviewed  Last Vitals:  Filed Vitals:   07/07/13 1700  BP: 95/49  Pulse: 80  Temp: 36.7 C  Resp: 18    Post vital signs: Reviewed  Level of consciousness:alert  Complications: No apparent anesthesia complications

## 2014-11-26 LAB — ANTIPHOSPHOLIPID SYNDROME EVAL, BLD
ANTICARDIOLIPIN IGG: 4 GPL U/mL — AB (ref <23)
ANTICARDIOLIPIN IGM: 1 [MPL'U]/mL — AB (ref <11)
Anticardiolipin IgA: 10 APL U/mL — ABNORMAL LOW (ref <22)
DRVVT: 36.5 secs (ref <42.9)
Lupus Anticoagulant: NOT DETECTED
PHOSPHATYDALSERINE, IGG: 4 U/mL (ref <16)
PTT Lupus Anticoagulant: 34.2 secs (ref 28.0–43.0)
Phosphatydalserine, IgA: 11 U/mL (ref <20)
Phosphatydalserine, IgM: 14 U/mL (ref <22)

## 2014-11-26 LAB — PARVOVIRUS B19 ANTIBODY, IGG AND IGM
PAROVIRUS B19 IGM ABS: 0.3 {index} (ref <0.9)
Parovirus B19 IgG Abs: 0.1 index (ref <0.9)

## 2014-12-24 LAB — CHROMOSOME STD, POC(TISSUE)-NCBH

## 2015-05-15 ENCOUNTER — Emergency Department (HOSPITAL_COMMUNITY)
Admission: EM | Admit: 2015-05-15 | Discharge: 2015-05-16 | Disposition: A | Discharge status: Home / Self Care | Payer: Medicaid Other | Attending: Emergency Medicine | Admitting: Emergency Medicine

## 2015-05-15 ENCOUNTER — Encounter (HOSPITAL_COMMUNITY): Payer: Self-pay | Admitting: Emergency Medicine

## 2015-05-15 DIAGNOSIS — R202 Paresthesia of skin: Secondary | ICD-10-CM | POA: Insufficient documentation

## 2015-05-15 DIAGNOSIS — Z3202 Encounter for pregnancy test, result negative: Secondary | ICD-10-CM | POA: Insufficient documentation

## 2015-05-15 DIAGNOSIS — Z8719 Personal history of other diseases of the digestive system: Secondary | ICD-10-CM | POA: Diagnosis not present

## 2015-05-15 DIAGNOSIS — R200 Anesthesia of skin: Secondary | ICD-10-CM | POA: Diagnosis not present

## 2015-05-15 LAB — COMPREHENSIVE METABOLIC PANEL
ALT: 19 U/L (ref 14–54)
ANION GAP: 9 (ref 5–15)
AST: 20 U/L (ref 15–41)
Albumin: 3.9 g/dL (ref 3.5–5.0)
Alkaline Phosphatase: 81 U/L (ref 38–126)
BUN: 10 mg/dL (ref 6–20)
CALCIUM: 9.1 mg/dL (ref 8.9–10.3)
CHLORIDE: 104 mmol/L (ref 101–111)
CO2: 23 mmol/L (ref 22–32)
Creatinine, Ser: 0.86 mg/dL (ref 0.44–1.00)
GLUCOSE: 93 mg/dL (ref 65–99)
POTASSIUM: 3.9 mmol/L (ref 3.5–5.1)
SODIUM: 136 mmol/L (ref 135–145)
Total Bilirubin: 0.7 mg/dL (ref 0.3–1.2)
Total Protein: 7.3 g/dL (ref 6.5–8.1)

## 2015-05-15 LAB — URINALYSIS, ROUTINE W REFLEX MICROSCOPIC
BILIRUBIN URINE: NEGATIVE
Glucose, UA: NEGATIVE mg/dL
Ketones, ur: NEGATIVE mg/dL
NITRITE: NEGATIVE
PROTEIN: NEGATIVE mg/dL
SPECIFIC GRAVITY, URINE: 1.024 (ref 1.005–1.030)
UROBILINOGEN UA: 1 mg/dL (ref 0.0–1.0)
pH: 7.5 (ref 5.0–8.0)

## 2015-05-15 LAB — CBC WITH DIFFERENTIAL/PLATELET
BASOS PCT: 0 % (ref 0–1)
Basophils Absolute: 0 10*3/uL (ref 0.0–0.1)
EOS ABS: 0.1 10*3/uL (ref 0.0–0.7)
EOS PCT: 1 % (ref 0–5)
HCT: 38 % (ref 36.0–46.0)
Hemoglobin: 12.9 g/dL (ref 12.0–15.0)
Lymphocytes Relative: 59 % — ABNORMAL HIGH (ref 12–46)
Lymphs Abs: 3.9 10*3/uL (ref 0.7–4.0)
MCH: 30.4 pg (ref 26.0–34.0)
MCHC: 33.9 g/dL (ref 30.0–36.0)
MCV: 89.6 fL (ref 78.0–100.0)
Monocytes Absolute: 0.5 10*3/uL (ref 0.1–1.0)
Monocytes Relative: 7 % (ref 3–12)
Neutro Abs: 2.2 10*3/uL (ref 1.7–7.7)
Neutrophils Relative %: 33 % — ABNORMAL LOW (ref 43–77)
PLATELETS: 307 10*3/uL (ref 150–400)
RBC: 4.24 MIL/uL (ref 3.87–5.11)
RDW: 12.2 % (ref 11.5–15.5)
WBC: 6.7 10*3/uL (ref 4.0–10.5)

## 2015-05-15 LAB — URINE MICROSCOPIC-ADD ON

## 2015-05-15 LAB — POC URINE PREG, ED: Preg Test, Ur: NEGATIVE

## 2015-05-15 MED ORDER — PREDNISONE 20 MG PO TABS
60.0000 mg | ORAL_TABLET | Freq: Once | ORAL | Status: AC
  Administered 2015-05-16: 60 mg via ORAL
  Filled 2015-05-15: qty 3

## 2015-05-15 MED ORDER — PREDNISONE 20 MG PO TABS: 60.0000 mg | ORAL_TABLET | Freq: Every day | ORAL | Status: DC

## 2015-05-15 NOTE — Discharge Instructions (Signed)
Paresthesia Margaret Russell, you were seen today for numbness.  This may be Multiple Sclerosis.  Try a steroid course and see if it helps.  This may also be something that will go away on its own.  See neurology within 3 days for close follow up.  If any symptoms worsen, come back to the ED immediately. Thank you.   Paresthesia is a burning or prickling feeling. This feeling can happen in any part of the body. It often happens in the hands, arms, legs, or feet. HOME CARE  Avoid drinking alcohol.  Try massage or needle therapy (acupuncture) to help with your problems.  Keep all doctor visits as told. GET HELP RIGHT AWAY IF:   You feel weak.  You have trouble walking or moving.  You have problems speaking or seeing.  You feel confused.  You cannot control when you poop (bowel movement) or pee (urinate).  You lose feeling (numbness) after an injury.  You pass out (faint).  Your burning or prickling feeling gets worse when you walk.  You have pain, cramps, or feel dizzy.  You have a rash. MAKE SURE YOU:   Understand these instructions.  Will watch your condition.  Will get help right away if you are not doing well or get worse. Document Released: 10/21/2008 Document Revised: 01/31/2012 Document Reviewed: 07/30/2011 [facility] Patient Information 2015 [place], [place]. This information is not intended to replace advice given to you by your health care provider. Make sure you discuss any questions you have with your health care provider.

## 2015-05-15 NOTE — ED Provider Notes (Signed)
CSN: [id number]     Arrival date & time 05/15/15  2138 History  This chart was scribed for Margaret Crumble, MD by Freida Busman, ED Scribe. This patient was seen in room A05C/A05C and the patient's care was started 11:48 PM.    Chief Complaint  Patient presents with  . Numbness   The history is provided by the patient. No language interpreter was used.     HPI Comments:  Margaret Russell is a 24 y.o. female who presents to the Emergency Department complaining of intermittent episodes of generalized numbness/tingling for a few weeks with more frequent and longer lasting episodes in the last few days. She denies fever, visual deficits, rhinorrhea, cough, and pain. She also denies family h/o autoimmune disorders. No alleviating factors noted. No recent fall/ head injury. She has no N/V/D or change in appetite.  She otherwise feels in her normal state of health.   Past Medical History  Diagnosis Date  . GERD (gastroesophageal reflux disease)    Past Surgical History  Procedure Laterality Date  . Colposcopy  05/07/14   No family history on file. History  Substance Use Topics  . Smoking status: Never Smoker   . Smokeless tobacco: Not on file  . Alcohol Use: No   OB History    Gravida Para Term Preterm AB TAB SAB Ectopic Multiple Living   0 0     Review of Systems  A complete 10 system review of systems was obtained and all systems are negative except as noted in the HPI and PMH.     Allergies  Review of patient's allergies indicates no known allergies.  Home Medications   Prior to Admission medications   Medication Sig Start Date End Date Taking? Authorizing Provider  hydrOXYzine (VISTARIL) 25 MG capsule 1-2 capsules as needed at bedtime for sleep 11/24/14   Marlinda Mike, CNM  ibuprofen (ADVIL,MOTRIN) 600 MG tablet Take 1 tablet (600 mg total) by mouth every 6 (six) hours. 11/24/14   Marlinda Mike, CNM  oxyCODONE-acetaminophen (PERCOCET/ROXICET) 5-325 MG per tablet  Take 1 tablet by mouth every 4 (four) hours as needed for moderate pain. 11/24/14   Marlinda Mike, CNM  Prenatal Vit-Fe Fumarate-FA (PRENATAL MULTIVITAMIN) TABS tablet Take 1 tablet by mouth daily at 12 noon.    Historical Provider, MD  sertraline (ZOLOFT) 50 MG tablet Take 1 tablet (50 mg total) by mouth at bedtime. 11/24/14   Marlinda Mike, CNM   BP 133/66 mmHg  Pulse 71  Temp(Src) 98.1 F (36.7 C) (Oral)  Resp 19  Ht  (1.727 m)  Wt 214 lb 1.6 oz (97.115 kg)  BMI 32.56 kg/m2  SpO2 98%  LMP  Physical Exam  Constitutional: She is oriented to person, place, and time. She appears well-developed and well-nourished. No distress.  HENT:  Head: Normocephalic and atraumatic.  Nose: Nose normal.  Mouth/Throat: Oropharynx is clear and moist. No oropharyngeal exudate.  Eyes: Conjunctivae and EOM are normal. Pupils are equal, round, and reactive to light. No scleral icterus.  Neck: Normal range of motion. Neck supple. No JVD present. No tracheal deviation present. No thyromegaly present.  Cardiovascular: Normal rate, regular rhythm and normal heart sounds.  Exam reveals no gallop and no friction rub.   No murmur heard. Pulmonary/Chest: Effort normal and breath sounds normal. No respiratory distress. She has no wheezes. She exhibits no tenderness.  Abdominal: Soft. Bowel sounds are normal. She exhibits no distension and no mass. There  is no tenderness. There is no rebound and no guarding.  Musculoskeletal: Normal range of motion. She exhibits no edema or tenderness.  Lymphadenopathy:    She has no cervical adenopathy.  Neurological: She is alert and oriented to person, place, and time. No cranial nerve deficit. She exhibits normal muscle tone.  Normal strength and sensation in all extremities.  Subjective tingling in BLE. Normal cerebellar testing and gait.  Skin: Skin is warm and dry. No rash noted. No erythema. No pallor.  Nursing note and vitals reviewed.   ED Course  Procedures    DIAGNOSTIC STUDIES:  Oxygen Saturation is 98% on RA, normal by my interpretation.    COORDINATION OF CARE:  11:53 PM Will discharge with prednisone. Discussed treatment plan with pt at bedside and pt agreed to plan.  Labs Review Labs Reviewed  CBC WITH DIFFERENTIAL/PLATELET - Abnormal; Notable for the following:    Neutrophils Relative % 33 (*)    Lymphocytes Relative 59 (*)    All other components within normal limits  URINALYSIS, ROUTINE W REFLEX MICROSCOPIC (NOT AT [facility]) - Abnormal; Notable for the following:    APPearance HAZY (*)    Hgb urine dipstick LARGE (*)    Leukocytes, UA SMALL (*)    All other components within normal limits  URINE MICROSCOPIC-ADD ON - Abnormal; Notable for the following:    Squamous Epithelial / LPF MANY (*)    Bacteria, UA FEW (*)    All other components within normal limits  COMPREHENSIVE METABOLIC PANEL  POC URINE PREG, ED    Imaging Review No results found.   EKG Interpretation None      MDM   Final diagnoses:  None   Patient presents to the ED for numbness in tingling in her 4 extremities.  This occurs randomly without any exacerbating or alleviating factors.  She has no other neuro symptoms with this and her neurological exam is normal in the ED.  Her labs reveal no electrolytes disturbances.  She has no family history of MS although this is a possibility.  I doubt other serious intracranial pathology for her intermittent numbness in the extremities only with no other features.  Will start a trial of prednisone for 5 days to assess for improvement. Neurology follow up also provided.  Her overall appears well and in NAD.  Her VS remain within her normal limits and she is safe for DC.    I personally performed the services described in this documentation, which was scribed in my presence. The recorded information has been reviewed and is accurate.    Margaret Crumble, MD 05/16/15 [phone]

## 2015-05-15 NOTE — ED Notes (Signed)
Pt. reports intermittent generalized numbness ( arms , hands,legs and feet) for several months . Denies pain , no fever or chills.

## 2015-07-11 ENCOUNTER — Ambulatory Visit (INDEPENDENT_AMBULATORY_CARE_PROVIDER_SITE_OTHER): Payer: Medicaid Other | Admitting: Neurology

## 2015-07-11 ENCOUNTER — Encounter: Payer: Self-pay | Admitting: *Deleted

## 2015-07-11 ENCOUNTER — Encounter: Payer: Self-pay | Admitting: Neurology

## 2015-07-11 VITALS — BP 136/70 | HR 76 | Resp 18 | Ht 68.5 in | Wt 218.5 lb

## 2015-07-11 DIAGNOSIS — R200 Anesthesia of skin: Secondary | ICD-10-CM

## 2015-07-11 DIAGNOSIS — R202 Paresthesia of skin: Secondary | ICD-10-CM | POA: Diagnosis not present

## 2015-07-11 NOTE — Patient Instructions (Signed)
Will check MRI of brain and cervical spine with and without contrast Will contact you with results and if any further testing, like labs, are required. Follow up in 6-8 weeks.

## 2015-07-11 NOTE — Progress Notes (Signed)
NEUROLOGY CONSULTATION NOTE  Margaret Russell MRN: [id number] DOB: [birth year]  Referring provider: [name], MD (ED referral) Primary care provider: none  Reason for consult:  Numbness and tingling, rule out MS  HISTORY OF PRESENT ILLNESS: Margaret Russell is a 24 year old right-handed female who presents for evaluation of intermittent numbness and tingling.  History obtained from patient and ED note.    She had a stillborn in January.  She was subsequently started on sertraline afterwards.  In February, she was started on Nexplenon for birth control.  She is not really sure when, but sometime after that, she started experiencing episodes of numbness and tingling mostly in the feet but also the hands.  Duration would vary to seconds or longer.  It would involve the entire hand and foot up to the wrist and ankle, respectively.  It would occur spontaneously and not associated with any kind of position.  She denies neck or back pain with radiating pain.  She denies weakness.  She denies visual disturbance.  She denies fatigue.  She was evaluated in the ED on 05/15/15 for these symptoms.  Her objective exam was normal, however she endorsed tingling in the lower extremities.  Labs, including CBC, CMP, and urine pregnancy test, were negative.  She was told that she may have MS.  About a week ago, she removed the Nexplenon, in case that was the cause of the symptoms.  She has noticed some improvement but it still occurs.    She has no history of migraine.  Her mother had a brain tumor and has migraines.  PAST MEDICAL HISTORY: Past Medical History  Diagnosis Date  . GERD (gastroesophageal reflux disease)     PAST SURGICAL HISTORY: Past Surgical History  Procedure Laterality Date  . Colposcopy  05/07/14    MEDICATIONS: Current Outpatient Prescriptions on File Prior to Visit  Medication Sig Dispense Refill  . hydrOXYzine (VISTARIL) 25 MG capsule 1-2 capsules as needed at bedtime for  sleep 20 capsule 0  . ibuprofen (ADVIL,MOTRIN) 600 MG tablet Take 1 tablet (600 mg total) by mouth every 6 (six) hours. 30 tablet 0  . sertraline (ZOLOFT) 50 MG tablet Take 1 tablet (50 mg total) by mouth at bedtime. 30 tablet 0  . oxyCODONE-acetaminophen (PERCOCET/ROXICET) 5-325 MG per tablet Take 1 tablet by mouth every 4 (four) hours as needed for moderate pain. (Patient not taking: Reported on 07/11/2015) 15 tablet 0  . predniSONE (DELTASONE) 20 MG tablet Take 3 tablets (60 mg total) by mouth daily. (Patient not taking: Reported on 07/11/2015) 12 tablet 0  . Prenatal Vit-Fe Fumarate-FA (PRENATAL MULTIVITAMIN) TABS tablet Take 1 tablet by mouth daily at 12 noon.     No current facility-administered medications on file prior to visit.    ALLERGIES: No Known Allergies  FAMILY HISTORY: Family History  Problem Relation Age of Onset  . Diabetes Paternal Grandmother   . Diabetes Paternal Grandfather     SOCIAL HISTORY: Social History   Social History  . Marital Status: Single    Spouse Name: N/A  . Number of Children: N/A  . Years of Education: N/A   Occupational History  . Not on file.   Social History Main Topics  . Smoking status: Never Smoker   . Smokeless tobacco: Not on file  . Alcohol Use: No  . Drug Use: No  . Sexual Activity:    Partners: Male    Birth Control/ Protection: Implant   Other Topics Concern  . Not  on file   Social History Narrative    REVIEW OF SYSTEMS: Constitutional: No fevers, chills, or sweats, no generalized fatigue, change in appetite Eyes: No visual changes, double vision, eye pain Ear, nose and throat: No hearing loss, ear pain, nasal congestion, sore throat Cardiovascular: No chest pain, palpitations Respiratory:  No shortness of breath at rest or with exertion, wheezes GastrointestinaI: No nausea, vomiting, diarrhea, abdominal pain, fecal incontinence Genitourinary:  No dysuria, urinary retention or frequency Musculoskeletal:  No neck  pain, back pain Integumentary: No rash, pruritus, skin lesions Neurological: as above Psychiatric: No depression, insomnia, anxiety Endocrine: No palpitations, fatigue, diaphoresis, mood swings, change in appetite, change in weight, increased thirst Hematologic/Lymphatic:  No anemia, purpura, petechiae. Allergic/Immunologic: no itchy/runny eyes, nasal congestion, recent allergic reactions, rashes  PHYSICAL EXAM: Filed Vitals:   07/11/15 0836  BP: 136/70  Pulse: 76  Resp: 18   General: No acute distress.  Patient appears well-groomed.   Head:  Normocephalic/atraumatic Eyes:  fundi unremarkable, without vessel changes, exudates, hemorrhages or papilledema. Neck: supple, no paraspinal tenderness, full range of motion Back: No paraspinal tenderness Heart: regular rate and rhythm Lungs: Clear to auscultation bilaterally. Vascular: No carotid bruits. Neurological Exam: Mental status: alert and oriented to person, place, and time, recent and remote memory intact, fund of knowledge intact, attention and concentration intact, speech fluent and not dysarthric, language intact. Cranial nerves: CN I: not tested CN II: pupils equal, round and reactive to light, visual fields intact, fundi unremarkable, without vessel changes, exudates, hemorrhages or papilledema. CN III, IV, VI:  full range of motion, no nystagmus, no ptosis CN V: facial sensation intact CN VII: upper and lower face symmetric CN VIII: hearing intact CN IX, X: gag intact, uvula midline CN XI: sternocleidomastoid and trapezius muscles intact CN XII: tongue midline Bulk & Tone: normal, no fasciculations. Motor:  5/5 throughout Sensation:  Pinprick and vibration sensation intact. Deep Tendon Reflexes:  2+ throughout, toes downgoing. Finger to nose testing:  intact Heel to shin:  intact Gait:  Normal station and stride.  Able to turn and tandem walk. Romberg negative.  IMPRESSION: Episodic numbness and tingling.  Vague  non-specific symptoms.  Given her young age and gender, a demyelinating disease is possible.  Peripheral neuropathy is also possible.  PLAN: 1.  Will check MRI of brain and cervical spine with and without contrast to rule out a demyelinating disease in a young woman.  Unfortunately, she is [employer], so only a PCP can order these tests.  She does not have a PCP.  Therefore, I recommended she establish care with a PCP ASAP so workup for her symptoms can begin. 2.  If unremarkable, would pursue neuropathy testing 3.  Follow up in 6-8 weeks.  Thank you for allowing me to take part in the care of this patient.  Shon Millet, DO

## 2015-09-05 ENCOUNTER — Ambulatory Visit: Payer: Medicaid Other | Admitting: Neurology

## 2015-09-20 ENCOUNTER — Ambulatory Visit (HOSPITAL_COMMUNITY)
Admission: RE | Admit: 2015-09-20 | Discharge: 2015-09-20 | Disposition: A | Discharge status: Home / Self Care | Payer: Medicaid Other | Attending: Psychiatry | Admitting: Psychiatry

## 2015-09-20 DIAGNOSIS — F321 Major depressive disorder, single episode, moderate: Secondary | ICD-10-CM | POA: Diagnosis not present

## 2015-09-20 NOTE — BH Assessment (Signed)
Assessment Note  Margaret Russell is an 24 y.o. female who presents as walk in to [facility] accompanied by mother and friend. Patient reported that her mom brought her to [facility] after she texted her mom the night prior about feeling like she had "no purpose in life and she didn't want to live." Patient reported 2 recent losses-- having a still born child at 21 weeks in January and losing an aunt who was a big support to her, over the summer. Patient denied current SI, HI, AVH and SA. Patient reported that she was feeling sad and empty inside and had issues with self esteem. Patient stated that when she made statements to mom she had no plan to end her life, she just felt like her life was meaningless after two major losses. Patient stated "I would never take my life, I'm too scared to do that."    Patient reported she was very close with her mother and did not want to communicate her level of depression with her mom because her mom was going through things and she did want to be a burden. Patient's mother expressed concern with patient being alone. Patient and mother reported having very close relationship and patient was normally very open with her.  Patient and mother opened up about her concerns and feelings and understanding the lost of her child. Patient presented with insight and understanding of what she needed to do manage depression moving forward.   Patient able to contract for safety at time of assessment and discuss coping skills such as listening to worship music, journaling and talking to mom and other family supports. Patient has natural supports and lives in home with mother who agreed to monitor patient. Patient also works a part time job and attends [employer]. Patient and mom were provided with Suicide Prevention Education packet and outpatient referrals. Clinician recommended she follow up with PCP to continue anti-depressants, and re-establish outpatient therapy.    Diagnosis: Major Depressive Disorder, moderate  Past Medical History:  Past Medical History  Diagnosis Date  . GERD (gastroesophageal reflux disease)     Past Surgical History  Procedure Laterality Date  . Colposcopy  05/07/14    Family History:  Family History  Problem Relation Age of Onset  . Diabetes Paternal Grandmother   . Diabetes Paternal Grandfather     Social History:  reports that she has never smoked. She does not have any smokeless tobacco history on file. She reports that she does not drink alcohol or use illicit drugs.  Additional Social History:  Alcohol / Drug Use Pain Medications: none  Prescriptions: Zoloft Over the Counter: none History of alcohol / drug use?: No history of alcohol / drug abuse  CIWA:   COWS:    Allergies: No Known Allergies  Home Medications:  (Not in a hospital admission)  OB/GYN Status:  No LMP recorded.  General Assessment Data Location of Assessment: [facility] TTS Assessment: In system Is this a Tele or Face-to-Face Assessment?: Face-to-Face Is this an Initial Assessment or a Re-assessment for this encounter?: Initial Assessment Marital status: Single Maiden name: NA Is patient pregnant?: Unknown Pregnancy Status: Unknown Living Arrangements: Parent Can pt return to current living arrangement?: Yes Admission Status: Voluntary Is patient capable of signing voluntary admission?: Yes Referral Source: Self/Family/Friend Insurance type: Medicaid  Medical Screening Exam [facility] Walk-in ONLY) Medical Exam completed: No  Crisis Care Plan Living Arrangements: Parent Name of Psychiatrist: none Name of Therapist: none  Education Status  Is patient currently in school?: Yes Current Grade: college Highest grade of school patient has completed: college Name of school: [employer] Contact person: none  Risk to self with the past 6 months Suicidal Ideation: No-Not Currently/Within Last 6  Months Has patient been a risk to self within the past 6 months prior to admission? : No Suicidal Intent: No-Not Currently/Within Last 6 Months Has patient had any suicidal intent within the past 6 months prior to admission? : No Is patient at risk for suicide?: No Suicidal Plan?: No Has patient had any suicidal plan within the past 6 months prior to admission? : No Access to Means: No What has been your use of drugs/alcohol within the last 12 months?: none Previous Attempts/Gestures: No How many times?: 0 Other Self Harm Risks: none Triggers for Past Attempts: None known Intentional Self Injurious Behavior: None Family Suicide History: Unknown Recent stressful life event(s): Loss (Comment) ( child- still born in Jan 2016, loss aunt in summer 2016) Persecutory voices/beliefs?: No Depression: Yes Depression Symptoms: Despondent, Feeling worthless/self pity, Tearfulness, Isolating, Loss of interest in usual pleasures Substance abuse history and/or treatment for substance abuse?: No Suicide prevention information given to non-admitted patients: Yes  Risk to Others within the past 6 months Homicidal Ideation: No Does patient have any lifetime risk of violence toward others beyond the six months prior to admission? : No Thoughts of Harm to Others: No Current Homicidal Intent: No Current Homicidal Plan: No Access to Homicidal Means: No Identified Victim: NA History of harm to others?: No Assessment of Violence: None Noted Violent Behavior Description: Patient calm and cooperative at assessment. Does patient have access to weapons?: No Criminal Charges Pending?: No Does patient have a court date: No Is patient on probation?: No  Psychosis Hallucinations: None noted Delusions: None noted  Mental Status Report Appearance/Hygiene: Unremarkable Eye Contact: Good Motor Activity: Unremarkable Speech: Logical/coherent Level of Consciousness: Alert Mood: Sad Affect:  Depressed Anxiety Level: None Thought Processes: Coherent, Relevant Judgement: Unimpaired Orientation: Person, Place, Time, Situation Obsessive Compulsive Thoughts/Behaviors: None  Cognitive Functioning Concentration: Normal Memory: Recent Intact, Remote Intact IQ: Average Insight: Good Impulse Control: Good Appetite: Fair Weight Loss: 0 Weight Gain: 0 (Patient reported some weight gain) Sleep: No Change Total Hours of Sleep: 7 Vegetative Symptoms: None  ADLScreening [facility] Assessment Services) Patient's cognitive ability adequate to safely complete daily activities?: Yes Patient able to express need for assistance with ADLs?: Yes Independently performs ADLs?: Yes (appropriate for developmental age)  Prior Inpatient Therapy Prior Inpatient Therapy: No Prior Therapy Dates: NA Prior Therapy Facilty/Provider(s): NA Reason for Treatment: NA  Prior Outpatient Therapy Prior Outpatient Therapy: Yes Prior Therapy Dates: Jan 2016 Prior Therapy Facilty/Provider(s): Hospice Reason for Treatment: loss of child Does patient have an ACCT team?: No Does patient have Intensive In-House Services?  : No Does patient have [facility] services? : No Does patient have P4CC services?: No  ADL Screening (condition at time of admission) Patient's cognitive ability adequate to safely complete daily activities?: Yes Is the patient deaf or have difficulty hearing?: No Does the patient have difficulty seeing, even when wearing glasses/contacts?: No Does the patient have difficulty concentrating, remembering, or making decisions?: No Patient able to express need for assistance with ADLs?: Yes Does the patient have difficulty dressing or bathing?: No Independently performs ADLs?: Yes (appropriate for developmental age)       Abuse/Neglect Assessment (Assessment to be complete while patient is alone) Physical Abuse: Denies Verbal Abuse: Denies Sexual Abuse: Denies  Exploitation of  patient/patient's resources: Denies Self-Neglect: Denies     Merchant navy officerAdvance Directives (For Healthcare) Does patient have an advance directive?: No    Additional Information 1:1 In Past 12 Months?: No CIRT Risk: No Elopement Risk: No Does patient have medical clearance?: Yes    Disposition: Clinician consulted with Margaret HatchetSheila (May) Lorelee CoverAugustin, NP who states Pt does not meet criteria for inpatient admission. Patient and mom were provided with Suicide Prevention Education packet and outpatient referrals.  Disposition Initial Assessment Completed for this Encounter: Yes Disposition of Patient: Outpatient treatment Type of outpatient treatment: Adult  On Site Evaluation by:   Reviewed with Physician:    Margaret Russell, Margaret Russell 09/20/2015 5:44 PM

## 2015-10-06 ENCOUNTER — Encounter (HOSPITAL_BASED_OUTPATIENT_CLINIC_OR_DEPARTMENT_OTHER): Payer: Self-pay | Admitting: Emergency Medicine

## 2015-10-06 ENCOUNTER — Emergency Department (HOSPITAL_BASED_OUTPATIENT_CLINIC_OR_DEPARTMENT_OTHER)
Admission: EM | Admit: 2015-10-06 | Discharge: 2015-10-07 | Disposition: A | Discharge status: Home / Self Care | Payer: Medicaid Other | Attending: Emergency Medicine | Admitting: Emergency Medicine

## 2015-10-06 DIAGNOSIS — R110 Nausea: Secondary | ICD-10-CM | POA: Diagnosis not present

## 2015-10-06 DIAGNOSIS — Z331 Pregnant state, incidental: Secondary | ICD-10-CM | POA: Insufficient documentation

## 2015-10-06 DIAGNOSIS — Z8719 Personal history of other diseases of the digestive system: Secondary | ICD-10-CM | POA: Diagnosis not present

## 2015-10-06 DIAGNOSIS — O26899 Other specified pregnancy related conditions, unspecified trimester: Secondary | ICD-10-CM

## 2015-10-06 DIAGNOSIS — R109 Unspecified abdominal pain: Secondary | ICD-10-CM | POA: Insufficient documentation

## 2015-10-06 DIAGNOSIS — Z9889 Other specified postprocedural states: Secondary | ICD-10-CM | POA: Diagnosis not present

## 2015-10-06 DIAGNOSIS — Z79899 Other long term (current) drug therapy: Secondary | ICD-10-CM | POA: Diagnosis not present

## 2015-10-06 LAB — LIPASE, BLOOD: Lipase: 33 U/L (ref 11–51)

## 2015-10-06 LAB — COMPREHENSIVE METABOLIC PANEL
ALK PHOS: 86 U/L (ref 38–126)
ALT: 20 U/L (ref 14–54)
AST: 22 U/L (ref 15–41)
Albumin: 4.1 g/dL (ref 3.5–5.0)
Anion gap: 6 (ref 5–15)
BUN: 8 mg/dL (ref 6–20)
CALCIUM: 9.1 mg/dL (ref 8.9–10.3)
CO2: 26 mmol/L (ref 22–32)
CREATININE: 0.8 mg/dL (ref 0.44–1.00)
Chloride: 106 mmol/L (ref 101–111)
GFR calc Af Amer: >60 mL/min (ref >60)
GFR calc non Af Amer: >60 mL/min (ref >60)
GLUCOSE: 115 mg/dL — AB (ref 65–99)
POTASSIUM: 3.5 mmol/L (ref 3.5–5.1)
Sodium: 138 mmol/L (ref 135–145)
TOTAL PROTEIN: 7.4 g/dL (ref 6.5–8.1)
Total Bilirubin: 0.6 mg/dL (ref 0.3–1.2)

## 2015-10-06 LAB — CBC
HCT: 35.9 % — ABNORMAL LOW (ref 36.0–46.0)
Hemoglobin: 11.9 g/dL — ABNORMAL LOW (ref 12.0–15.0)
MCH: 30.4 pg (ref 26.0–34.0)
MCHC: 33.1 g/dL (ref 30.0–36.0)
MCV: 91.6 fL (ref 78.0–100.0)
PLATELETS: 335 10*3/uL (ref 150–400)
RBC: 3.92 MIL/uL (ref 3.87–5.11)
RDW: 12.3 % (ref 11.5–15.5)
WBC: 8.6 10*3/uL (ref 4.0–10.5)

## 2015-10-06 LAB — URINALYSIS, ROUTINE W REFLEX MICROSCOPIC
Bilirubin Urine: NEGATIVE
Glucose, UA: NEGATIVE mg/dL
Hgb urine dipstick: NEGATIVE
Ketones, ur: NEGATIVE mg/dL
NITRITE: NEGATIVE
PROTEIN: NEGATIVE mg/dL
Specific Gravity, Urine: 1.028 (ref 1.005–1.030)
UROBILINOGEN UA: 1 mg/dL (ref 0.0–1.0)
pH: 6 (ref 5.0–8.0)

## 2015-10-06 LAB — PREGNANCY, URINE: Preg Test, Ur: POSITIVE — AB

## 2015-10-06 LAB — HCG, QUANTITATIVE, PREGNANCY: hCG, Beta Chain, Quant, S: 470 m[IU]/mL — ABNORMAL HIGH (ref <5)

## 2015-10-06 LAB — URINE MICROSCOPIC-ADD ON

## 2015-10-06 NOTE — ED Provider Notes (Signed)
CSN: [id number]     Arrival date & time 10/06/15  1958 History  By signing my name below, I, Budd Palmer, attest that this documentation has been prepared under the direction and in the presence of Loren Racer, MD. Electronically Signed: Budd Palmer, ED Scribe. 10/07/2015. 12:04 AM.    Chief Complaint  Patient presents with  . Abdominal Pain   The history is provided by the patient. No language interpreter was used.   HPI Comments: Margaret Russell is a 24 y.o. female with a PMHx of GERD who presents to the Emergency Department complaining of intermittent, abdominal pain on alternating sides onset 2 weeks ago. She reports associated nausea. She notes her LNMP was October 9th. She states she has been pregnant twice before. She reports her first pregnancy was an elective termination, and the second was "stillborn" this January. Pt denies vaginal bleeding and discharge, as well as dysuria and frequency. No vomiting, constipation or diarrhea. No fever or chills  Past Medical History  Diagnosis Date  . GERD (gastroesophageal reflux disease)    Past Surgical History  Procedure Laterality Date  . Colposcopy  05/07/14   Family History  Problem Relation Age of Onset  . Diabetes Paternal Grandmother   . Diabetes Paternal Grandfather    Social History  Substance Use Topics  . Smoking status: Never Smoker   . Smokeless tobacco: None  . Alcohol Use: No   OB History    Gravida Para Term Preterm AB TAB SAB Ectopic Multiple Living   0 0     Review of Systems  Constitutional: Negative for fever and chills.  Respiratory: Negative for shortness of breath.   Cardiovascular: Negative for chest pain.  Gastrointestinal: Positive for nausea and abdominal pain. Negative for vomiting, diarrhea, constipation and abdominal distention.  Genitourinary: Negative for dysuria, frequency, flank pain, vaginal bleeding, vaginal discharge and pelvic pain.  Musculoskeletal: Negative for  myalgias, back pain, neck pain and neck stiffness.  Skin: Negative for rash.  Neurological: Negative for dizziness, weakness, light-headedness, numbness and headaches.  All other systems reviewed and are negative.   Allergies  Review of patient's allergies indicates no known allergies.  Home Medications   Prior to Admission medications   Medication Sig Start Date End Date Taking? Authorizing Provider  sertraline (ZOLOFT) 50 MG tablet Take 1 tablet (50 mg total) by mouth at bedtime. 11/24/14  Yes Marlinda Mike, CNM  hydrOXYzine (VISTARIL) 25 MG capsule 1-2 capsules as needed at bedtime for sleep 11/24/14   Marlinda Mike, CNM  ondansetron (ZOFRAN) 4 MG tablet Take 1 tablet (4 mg total) by mouth every 8 (eight) hours as needed for nausea or vomiting. 10/07/15   Loren Racer, MD  oxyCODONE-acetaminophen (PERCOCET/ROXICET) 5-325 MG per tablet Take 1 tablet by mouth every 4 (four) hours as needed for moderate pain. Patient not taking: Reported on 07/11/2015 11/24/14   Marlinda Mike, CNM  predniSONE (DELTASONE) 20 MG tablet Take 3 tablets (60 mg total) by mouth daily. Patient not taking: Reported on 07/11/2015 05/15/15   [name], MD  Prenatal Vit-Fe Fumarate-FA (PRENATAL MULTIVITAMIN) TABS tablet Take 1 tablet by mouth daily at 12 noon.    Historical Provider, MD   BP 113/65 mmHg  Pulse 72  Temp(Src) 98.1 F (36.7 C) (Oral)  Resp 16  Ht 5' 8.5" (1.74 m)  Wt 208 lb (94.348 kg)  BMI 31.16 kg/m2  SpO2 99%  LMP 08/31/2015 Physical Exam  Constitutional: She is oriented  to person, place, and time. She appears well-developed and well-nourished. No distress.  HENT:  Head: Normocephalic and atraumatic.  Mouth/Throat: Oropharynx is clear and moist.  Eyes: EOM are normal. Pupils are equal, round, and reactive to light.  Neck: Normal range of motion. Neck supple.  Cardiovascular: Normal rate and regular rhythm.   Pulmonary/Chest: Effort normal and breath sounds normal. No respiratory distress. She  has no wheezes. She has no rales.  Abdominal: Soft. Bowel sounds are normal. She exhibits no distension and no mass. There is no tenderness. There is no rebound and no guarding.  Musculoskeletal: Normal range of motion. She exhibits no edema or tenderness.  No CVA tenderness bilaterally.  Neurological: She is alert and oriented to person, place, and time.  Skin: Skin is warm and dry. No rash noted. No erythema.  Psychiatric: She has a normal mood and affect. Her behavior is normal.  Nursing note and vitals reviewed.   ED Course  Procedures  DIAGNOSTIC STUDIES: Oxygen Saturation is 100% on RA, normal by my interpretation.    COORDINATION OF CARE: 11:54 PM - Discussed lab results of pregnancy. Discussed plans to order a [place]. Pt advised of plan for treatment and pt agrees.  Labs Review Labs Reviewed  URINALYSIS, ROUTINE W REFLEX MICROSCOPIC (NOT AT [facility]) - Abnormal; Notable for the following:    APPearance CLOUDY (*)    Leukocytes, UA TRACE (*)    All other components within normal limits  PREGNANCY, URINE - Abnormal; Notable for the following:    Preg Test, Ur POSITIVE (*)    All other components within normal limits  URINE MICROSCOPIC-ADD ON - Abnormal; Notable for the following:    Squamous Epithelial / LPF FEW (*)    All other components within normal limits  COMPREHENSIVE METABOLIC PANEL - Abnormal; Notable for the following:    Glucose, Bld 115 (*)    All other components within normal limits  CBC - Abnormal; Notable for the following:    Hemoglobin 11.9 (*)    HCT 35.9 (*)    All other components within normal limits  HCG, QUANTITATIVE, PREGNANCY - Abnormal; Notable for the following:    hCG, Beta Chain, Quant, S 470 (*)    All other components within normal limits  LIPASE, BLOOD    Imaging Review US Ob Comp Less 14 Wks  10/07/2015  CLINICAL DATA:  Pregnant patient with abdominal pain.  Beta HCG 470. EXAM: OBSTETRIC <14 WK [place] AND TRANSVAGINAL OB US TECHNIQUE: Both  transabdominal and transvaginal ultrasound examinations were performed for complete evaluation of the gestation as well as the maternal uterus, adnexal regions, and pelvic cul-de-sac. Transvaginal technique was performed to assess early pregnancy. COMPARISON:  None. FINDINGS: Intrauterine gestational sac: Questionable small intrauterine gestational sac. Yolk sac:  Not present Embryo:  Not present. Cardiac Activity: Not present. MSD: 2.8  mm   5 w   0 d   [place] EDC: 06/08/2016 Maternal uterus/adnexae: Thickened endometrium containing a probable early intrauterine gestational sac. The right ovary measures 3.9 x 4.8 x 2.9 cm and contains a corpus luteal cyst. Left ovary is normal measuring 2.2 x 1.2 x 2.8 cm. There is an exophytic or paraovarian cysts measuring 1.5 cm. Trace pelvic free fluid. IMPRESSION: Probable early intrauterine gestational sac, but no yolk sac, fetal pole, or cardiac activity yet visualized. Recommend follow-up quantitative B-HCG levels and follow-up US in 14 days to confirm and assess viability. This recommendation follows SRU consensus guidelines: Diagnostic Criteria for Nonviable Pregnancy Early  in the First Trimester. Malva Limes Engl J Med 2013; 161:0960-45; 369:1443-51. Electronically Signed   By: Rubye OaksMelanie  Ehinger M.D.   On: 10/07/2015 01:02   [place] Ob Transvaginal  10/07/2015  CLINICAL DATA:  Pregnant patient with abdominal pain.  Beta HCG 470. EXAM: OBSTETRIC <14 WK US AND TRANSVAGINAL OB US TECHNIQUE: Both transabdominal and transvaginal ultrasound examinations were performed for complete evaluation of the gestation as well as the maternal uterus, adnexal regions, and pelvic cul-de-sac. Transvaginal technique was performed to assess early pregnancy. COMPARISON:  None. FINDINGS: Intrauterine gestational sac: Questionable small intrauterine gestational sac. Yolk sac:  Not present Embryo:  Not present. Cardiac Activity: Not present. MSD: 2.8  mm   5 w   0 d   US EDC: 06/08/2016 Maternal uterus/adnexae: Thickened  endometrium containing a probable early intrauterine gestational sac. The right ovary measures 3.9 x 4.8 x 2.9 cm and contains a corpus luteal cyst. Left ovary is normal measuring 2.2 x 1.2 x 2.8 cm. There is an exophytic or paraovarian cysts measuring 1.5 cm. Trace pelvic free fluid. IMPRESSION: Probable early intrauterine gestational sac, but no yolk sac, fetal pole, or cardiac activity yet visualized. Recommend follow-up quantitative B-HCG levels and follow-up US in 14 days to confirm and assess viability. This recommendation follows SRU consensus guidelines: Diagnostic Criteria for Nonviable Pregnancy Early in the First Trimester. Malva Limes Engl J Med 2013; 409:8119-14; 369:1443-51. Electronically Signed   By: Rubye OaksMelanie  Ehinger M.D.   On: 10/07/2015 01:02   I have personally reviewed and evaluated these images and lab results as part of my medical decision-making.   EKG Interpretation None      MDM   Final diagnoses:  Abdominal pain in pregnancy   I personally performed the services described in this documentation, which was scribed in my presence. The recorded information has been reviewed and is accurate.    Patient is well-appearing. She has a normal abdominal exam. Pregnancy test is positive with probable early intrauterine gestational sac. Advised patient to follow-up with her OB/GYN this week. She's been given return precautions to return for worsening pain, vaginal bleeding, fever or for any concerns.    Loren Raceravid Isabellah Sobocinski, MD 10/07/15 [phone]

## 2015-10-06 NOTE — ED Notes (Signed)
Pt reports lower abdominal pain x 2 weeks, nothing improves pain but nothing worses pain, pt denies any previous pain, denies vomiting or diarrhea, +nausea

## 2015-10-07 ENCOUNTER — Emergency Department (HOSPITAL_BASED_OUTPATIENT_CLINIC_OR_DEPARTMENT_OTHER): Payer: Medicaid Other

## 2015-10-07 MED ORDER — ONDANSETRON HCL 4 MG PO TABS: 4.0000 mg | ORAL_TABLET | Freq: Three times a day (TID) | ORAL | Status: DC | PRN

## 2015-10-07 NOTE — ED Notes (Signed)
MD at bedside. 

## 2015-10-07 NOTE — Discharge Instructions (Signed)

## 2015-10-26 ENCOUNTER — Encounter (HOSPITAL_COMMUNITY): Payer: Self-pay | Admitting: *Deleted

## 2015-10-26 ENCOUNTER — Inpatient Hospital Stay (HOSPITAL_COMMUNITY): Payer: Medicaid Other

## 2015-10-26 ENCOUNTER — Inpatient Hospital Stay (HOSPITAL_COMMUNITY)
Admission: AD | Admit: 2015-10-26 | Discharge: 2015-10-26 | Disposition: A | Discharge status: Home / Self Care | Payer: Medicaid Other | Source: Ambulatory Visit | Attending: Obstetrics & Gynecology | Admitting: Obstetrics & Gynecology

## 2015-10-26 DIAGNOSIS — O209 Hemorrhage in early pregnancy, unspecified: Secondary | ICD-10-CM | POA: Diagnosis not present

## 2015-10-26 DIAGNOSIS — O468X1 Other antepartum hemorrhage, first trimester: Secondary | ICD-10-CM | POA: Diagnosis not present

## 2015-10-26 DIAGNOSIS — Z3A01 Less than 8 weeks gestation of pregnancy: Secondary | ICD-10-CM | POA: Insufficient documentation

## 2015-10-26 DIAGNOSIS — O418X10 Other specified disorders of amniotic fluid and membranes, first trimester, not applicable or unspecified: Secondary | ICD-10-CM

## 2015-10-26 LAB — URINALYSIS, ROUTINE W REFLEX MICROSCOPIC
Bilirubin Urine: NEGATIVE
Glucose, UA: NEGATIVE mg/dL
Ketones, ur: NEGATIVE mg/dL
LEUKOCYTES UA: NEGATIVE
NITRITE: NEGATIVE
PH: 6.5 (ref 5.0–8.0)
Protein, ur: NEGATIVE mg/dL
SPECIFIC GRAVITY, URINE: 1.01 (ref 1.005–1.030)

## 2015-10-26 LAB — CBC
HEMATOCRIT: 35.4 % — AB (ref 36.0–46.0)
HEMOGLOBIN: 11.9 g/dL — AB (ref 12.0–15.0)
MCH: 30.4 pg (ref 26.0–34.0)
MCHC: 33.6 g/dL (ref 30.0–36.0)
MCV: 90.3 fL (ref 78.0–100.0)
Platelets: 308 10*3/uL (ref 150–400)
RBC: 3.92 MIL/uL (ref 3.87–5.11)
RDW: 12.6 % (ref 11.5–15.5)
WBC: 7.1 10*3/uL (ref 4.0–10.5)

## 2015-10-26 LAB — WET PREP, GENITAL
Sperm: NONE SEEN
TRICH WET PREP: NONE SEEN
YEAST WET PREP: NONE SEEN

## 2015-10-26 LAB — URINE MICROSCOPIC-ADD ON

## 2015-10-26 LAB — HCG, QUANTITATIVE, PREGNANCY: hCG, Beta Chain, Quant, S: 65811 m[IU]/mL — ABNORMAL HIGH (ref <5)

## 2015-10-26 NOTE — MAU Note (Signed)
Bleeding noted when went to the restroom. Noted in underwear and when she wipes. Is light red. No clots, no pain.

## 2015-10-26 NOTE — MAU Provider Note (Signed)
Chief Complaint: Vaginal Bleeding   First Provider Initiated Contact with Patient 10/26/15 1340      SUBJECTIVE  HPI  Margaret Russell is a 24 y.o. G3P0110 at 487w0d by LMP who presents to maternity admissions reporting light vaginal bleeding since this morning.  Was seen for abdominal pain on .10/06/15 and HCG level was 470. A small gestational sac was seen. States pain has improved but bleeding worries her.   She denies vaginal itching/burning, urinary symptoms, h/a, dizziness, n/v, or fever/chills.    History is remarkable for an IUFD at 32 weeks a year ago.  RN Note: Bleeding noted when went to the restroom. Noted in underwear and when she wipes. Is light red. No clots, no pain.          Past Medical History  Diagnosis Date  . GERD (gastroesophageal reflux disease)    Past Surgical History  Procedure Laterality Date  . Colposcopy  05/07/14   Social History   Social History  . Marital Status: Single    Spouse Name: N/A  . Number of Children: N/A  . Years of Education: N/A   Occupational History  . Not on file.   Social History Main Topics  . Smoking status: Never Smoker   . Smokeless tobacco: Not on file  . Alcohol Use: No  . Drug Use: No  . Sexual Activity:    Partners: Male    Birth Control/ Protection: Implant   Other Topics Concern  . Not on file   Social History Narrative   No current facility-administered medications on file prior to encounter.   Current Outpatient Prescriptions on File Prior to Encounter  Medication Sig Dispense Refill  . Prenatal Vit-Fe Fumarate-FA (PRENATAL MULTIVITAMIN) TABS tablet Take 1 tablet by mouth daily at 12 noon.    . hydrOXYzine (VISTARIL) 25 MG capsule 1-2 capsules as needed at bedtime for sleep (Patient not taking: Reported on 10/26/2015) 20 capsule 0  . ondansetron (ZOFRAN) 4 MG tablet Take 1 tablet (4 mg total) by mouth every 8 (eight) hours as needed for nausea or vomiting. (Patient not taking: Reported on  10/26/2015) 12 tablet 0  . oxyCODONE-acetaminophen (PERCOCET/ROXICET) 5-325 MG per tablet Take 1 tablet by mouth every 4 (four) hours as needed for moderate pain. (Patient not taking: Reported on 07/11/2015) 15 tablet 0  . predniSONE (DELTASONE) 20 MG tablet Take 3 tablets (60 mg total) by mouth daily. (Patient not taking: Reported on 07/11/2015) 12 tablet 0  . sertraline (ZOLOFT) 50 MG tablet Take 1 tablet (50 mg total) by mouth at bedtime. (Patient not taking: Reported on 10/26/2015) 30 tablet 0   No Known Allergies  ROS:  Review of Systems  Constitutional: Negative for fever and chills.  Respiratory: Negative for shortness of breath.   Gastrointestinal: Negative for nausea, vomiting, abdominal pain, diarrhea and constipation.  Genitourinary: Positive for vaginal bleeding. Negative for dysuria, difficulty urinating and pelvic pain.  Musculoskeletal: Negative for back pain.   I have reviewed patient's Past Medical Hx, Surgical Hx, Family Hx, Social Hx, medications and allergies.   Physical Exam  Patient Vitals for the past 24 hrs:  BP Temp Temp src Pulse Resp Height Weight  10/26/15 1251 113/63 mmHg 98.1 F (36.7 C) Oral 88 18 5\' 6"  (1.676 m) 107.14 kg (236 lb 3.2 oz)   Physical Exam Constitutional: Well-developed, well-nourished female in no acute distress.  Cardiovascular: normal rate Respiratory: normal effort GI: Abd soft, non-tender. Pos BS x 4 MS: Extremities nontender, no edema, normal  ROM Neurologic: Alert and oriented x 4.  GU: Neg CVAT.  PELVIC EXAM: Cervix pink, visually closed, without lesion, scant blood tinged discharge, vaginal walls and external genitalia normal Bimanual exam: Cervix 0/long/high, firm, anterior, neg CMT, uterus nontender, nonenlarged, adnexa without tenderness, enlargement, or mass   LAB RESULTS Results for orders placed or performed during the hospital encounter of 10/26/15 (from the past 24 hour(s))  Urinalysis, Routine w reflex microscopic (not  at [facility])     Status: Abnormal   Collection Time: 10/26/15 12:55 PM  Result Value Ref Range   Color, Urine YELLOW YELLOW   APPearance CLEAR CLEAR   Specific Gravity, Urine 1.010 1.005 - 1.030   pH 6.5 5.0 - 8.0   Glucose, UA NEGATIVE NEGATIVE mg/dL   Hgb urine dipstick MODERATE (A) NEGATIVE   Bilirubin Urine NEGATIVE NEGATIVE   Ketones, ur NEGATIVE NEGATIVE mg/dL   Protein, ur NEGATIVE NEGATIVE mg/dL   Nitrite NEGATIVE NEGATIVE   Leukocytes, UA NEGATIVE NEGATIVE  Urine microscopic-add on     Status: Abnormal   Collection Time: 10/26/15 12:55 PM  Result Value Ref Range   Squamous Epithelial / LPF 0-5 (A) NONE SEEN   WBC, UA 0-5 0 - 5 WBC/hpf   RBC / HPF 0-5 0 - 5 RBC/hpf   Bacteria, UA FEW (A) NONE SEEN  CBC     Status: Abnormal   Collection Time: 10/26/15  1:25 PM  Result Value Ref Range   WBC 7.1 4.0 - 10.5 K/uL   RBC 3.92 3.87 - 5.11 MIL/uL   Hemoglobin 11.9 (L) 12.0 - 15.0 g/dL   HCT 16.1 (L) 09.6 - 04.5 %   MCV 90.3 78.0 - 100.0 fL   MCH 30.4 26.0 - 34.0 pg   MCHC 33.6 30.0 - 36.0 g/dL   RDW 40.9 81.1 - 91.4 %   Platelets 308 150 - 400 K/uL  Results for RABIA, ARGOTE (MRN [id number]) as of 10/26/2015 14:32  Ref. Range 10/26/2015 13:25  HCG, Beta Chain, Quant, S Latest Ref Range: <5 mIU/mL 65811 (H)    --/--/B POS, B POS (12/31 1855)  IMAGING US Ob Comp Less 14 Wks  10/07/2015  CLINICAL DATA:  Pregnant patient with abdominal pain.  Beta HCG 470. EXAM: OBSTETRIC <14 WK [place] AND TRANSVAGINAL OB US TECHNIQUE: Both transabdominal and transvaginal ultrasound examinations were performed for complete evaluation of the gestation as well as the maternal uterus, adnexal regions, and pelvic cul-de-sac. Transvaginal technique was performed to assess early pregnancy. COMPARISON:  None. FINDINGS: Intrauterine gestational sac: Questionable small intrauterine gestational sac. Yolk sac:  Not present Embryo:  Not present. Cardiac Activity: Not present. MSD: 2.8  mm   5 w   0 d   [place] EDC:  06/08/2016 Maternal uterus/adnexae: Thickened endometrium containing a probable early intrauterine gestational sac. The right ovary measures 3.9 x 4.8 x 2.9 cm and contains a corpus luteal cyst. Left ovary is normal measuring 2.2 x 1.2 x 2.8 cm. There is an exophytic or paraovarian cysts measuring 1.5 cm. Trace pelvic free fluid. IMPRESSION: Probable early intrauterine gestational sac, but no yolk sac, fetal pole, or cardiac activity yet visualized. Recommend follow-up quantitative B-HCG levels and follow-up US in 14 days to confirm and assess viability. This recommendation follows SRU consensus guidelines: Diagnostic Criteria for Nonviable Pregnancy Early in the First Trimester. Malva Limes Med 2013; 086:5784-69. Electronically Signed   By: Rubye Oaks M.D.   On: 10/07/2015 01:02   US Ob Transvaginal  10/07/2015  CLINICAL  DATA:  Pregnant patient with abdominal pain.  Beta HCG 470. EXAM: OBSTETRIC <14 WK [place] AND TRANSVAGINAL OB US TECHNIQUE: Both transabdominal and transvaginal ultrasound examinations were performed for complete evaluation of the gestation as well as the maternal uterus, adnexal regions, and pelvic cul-de-sac. Transvaginal technique was performed to assess early pregnancy. COMPARISON:  None. FINDINGS: Intrauterine gestational sac: Questionable small intrauterine gestational sac. Yolk sac:  Not present Embryo:  Not present. Cardiac Activity: Not present. MSD: 2.8  mm   5 w   0 d   [place] EDC: 06/08/2016 Maternal uterus/adnexae: Thickened endometrium containing a probable early intrauterine gestational sac. The right ovary measures 3.9 x 4.8 x 2.9 cm and contains a corpus luteal cyst. Left ovary is normal measuring 2.2 x 1.2 x 2.8 cm. There is an exophytic or paraovarian cysts measuring 1.5 cm. Trace pelvic free fluid. IMPRESSION: Probable early intrauterine gestational sac, but no yolk sac, fetal pole, or cardiac activity yet visualized. Recommend follow-up quantitative B-HCG levels and follow-up US  in 14 days to confirm and assess viability. This recommendation follows SRU consensus guidelines: Diagnostic Criteria for Nonviable Pregnancy Early in the First Trimester. Malva Limes Med 2013; 161:0960-45. Electronically Signed   By: Rubye Oaks M.D.   On: 10/07/2015 01:02   US Ob Comp Less 14 Wks  10/07/2015  CLINICAL DATA:  Pregnant patient with abdominal pain.  Beta HCG 470. EXAM: OBSTETRIC <14 WK [place] AND TRANSVAGINAL OB US TECHNIQUE: Both transabdominal and transvaginal ultrasound examinations were performed for complete evaluation of the gestation as well as the maternal uterus, adnexal regions, and pelvic cul-de-sac. Transvaginal technique was performed to assess early pregnancy. COMPARISON:  None. FINDINGS: Intrauterine gestational sac: Questionable small intrauterine gestational sac. Yolk sac:  Not present Embryo:  Not present. Cardiac Activity: Not present. MSD: 2.8  mm   5 w   0 d   [place] EDC: 06/08/2016 Maternal uterus/adnexae: Thickened endometrium containing a probable early intrauterine gestational sac. The right ovary measures 3.9 x 4.8 x 2.9 cm and contains a corpus luteal cyst. Left ovary is normal measuring 2.2 x 1.2 x 2.8 cm. There is an exophytic or paraovarian cysts measuring 1.5 cm. Trace pelvic free fluid. IMPRESSION: Probable early intrauterine gestational sac, but no yolk sac, fetal pole, or cardiac activity yet visualized. Recommend follow-up quantitative B-HCG levels and follow-up US in 14 days to confirm and assess viability. This recommendation follows SRU consensus guidelines: Diagnostic Criteria for Nonviable Pregnancy Early in the First Trimester. Malva Limes Med 2013; 409:8119-14. Electronically Signed   By: Rubye Oaks M.D.   On: 10/07/2015 01:02   US Ob Transvaginal  10/26/2015  CLINICAL DATA:  Vaginal bleeding. Patient is 8 weeks 0 days pregnant by last menstrual period. EXAM: TRANSVAGINAL OB ULTRASOUND TECHNIQUE: Transvaginal ultrasound was performed for complete  evaluation of the gestation as well as the maternal uterus, adnexal regions, and pelvic cul-de-sac. COMPARISON:  None. FINDINGS: Intrauterine gestational sac: Visualized/normal in shape. Yolk sac:  Visualized Embryo:  Visualized Cardiac Activity: Visualized Heart Rate: 143 bpm CRL:   8.7 mm  mm   6 w 6 d                  [place] EDC: 06/14/2016 There is a small subchorionic hemorrhage, best seen on images 36 and 37. Maternal uterus/adnexae: Normal ovaries visualized. IMPRESSION: Single live intrauterine pregnancy corresponding to 6 weeks and 6 days gestation. Small subchorionic hemorrhage. Electronically Signed   By: Ulanda Edison.D.  On: 10/26/2015 15:42   US Ob Transvaginal  10/07/2015  CLINICAL DATA:  Pregnant patient with abdominal pain.  Beta HCG 470. EXAM: OBSTETRIC <14 WK [place] AND TRANSVAGINAL OB US TECHNIQUE: Both transabdominal and transvaginal ultrasound examinations were performed for complete evaluation of the gestation as well as the maternal uterus, adnexal regions, and pelvic cul-de-sac. Transvaginal technique was performed to assess early pregnancy. COMPARISON:  None. FINDINGS: Intrauterine gestational sac: Questionable small intrauterine gestational sac. Yolk sac:  Not present Embryo:  Not present. Cardiac Activity: Not present. MSD: 2.8  mm   5 w   0 d   [place] EDC: 06/08/2016 Maternal uterus/adnexae: Thickened endometrium containing a probable early intrauterine gestational sac. The right ovary measures 3.9 x 4.8 x 2.9 cm and contains a corpus luteal cyst. Left ovary is normal measuring 2.2 x 1.2 x 2.8 cm. There is an exophytic or paraovarian cysts measuring 1.5 cm. Trace pelvic free fluid. IMPRESSION: Probable early intrauterine gestational sac, but no yolk sac, fetal pole, or cardiac activity yet visualized. Recommend follow-up quantitative B-HCG levels and follow-up US in 14 days to confirm and assess viability. This recommendation follows SRU consensus guidelines: Diagnostic Criteria for  Nonviable Pregnancy Early in the First Trimester. Malva Limes Med 2013; 409:8119-14. Electronically Signed   By: Rubye Oaks M.D.   On: 10/07/2015 01:02   [place] today showed SIUP at [redacted]w[redacted]d with good FHR and a small subchorionic hemorrhage.  MAU Management/MDM: Ordered labs and reviewed results.   Pt stable at time of discharge.  This bleeding could have  represented a normal pregnancy with bleeding, spontaneous abortion or even an ectopic which can be life-threatening.   Cultures were done to rule out pelvic infection Blood drawn for Quant HCG, CBC    ASSESSMENT SIUP at [redacted]w[redacted]d  Small subchorionic hemorrhage  PLAN Discharge home Encouraged to seek early prenatal care. States Dr Cherly Hensen told her to go to [facility] since she is high risk.   Bleeding precautions reviewed. Told her she may continue to have light bleeding for a few weeks.     Medication List    ASK your doctor about these medications        hydrOXYzine 25 MG capsule  Commonly known as:  VISTARIL  1-2 capsules as needed at bedtime for sleep     omeprazole 20 MG capsule  Commonly known as:  PRILOSEC  Take 20 mg by mouth daily.     ondansetron 4 MG tablet  Commonly known as:  ZOFRAN  Take 1 tablet (4 mg total) by mouth every 8 (eight) hours as needed for nausea or vomiting.     oxyCODONE-acetaminophen 5-325 MG tablet  Commonly known as:  PERCOCET/ROXICET  Take 1 tablet by mouth every 4 (four) hours as needed for moderate pain.     predniSONE 20 MG tablet  Commonly known as:  DELTASONE  Take 3 tablets (60 mg total) by mouth daily.     prenatal multivitamin Tabs tablet  Take 1 tablet by mouth daily at 12 noon.     sertraline 50 MG tablet  Commonly known as:  ZOLOFT  Take 1 tablet (50 mg total) by mouth at bedtime.         Wynelle Bourgeois CNM, MSN Certified Nurse-Midwife 10/26/2015  2:06 PM

## 2015-10-26 NOTE — MAU Note (Signed)
Pt to bathroom, encouraged to put on pad so that her bleeding can be assessed.  Pt's mom again states, she is not bleeding like that. Encouraged again to put on pad, in case her bleeding increases and so that it can be assessed

## 2015-10-26 NOTE — Discharge Instructions (Signed)
Pelvic Rest °Pelvic rest is sometimes recommended for women when:  °· The placenta is partially or completely covering the opening of the cervix (placenta previa). °· There is bleeding between the uterine wall and the amniotic sac in the first trimester (subchorionic hemorrhage). °· The cervix begins to open without labor starting (incompetent cervix, cervical insufficiency). °· The labor is too early (preterm labor). °HOME CARE INSTRUCTIONS °· Do not have sexual intercourse, stimulation, or an orgasm. °· Do not use tampons, douche, or put anything in the vagina. °· Do not lift anything over 10 pounds (4.5 kg). °· Avoid strenuous activity or straining your pelvic muscles. °SEEK MEDICAL CARE IF:  °· You have any vaginal bleeding during pregnancy. Treat this as a potential emergency. °· You have cramping pain felt low in the stomach (stronger than menstrual cramps). °· You notice vaginal discharge (watery, mucus, or bloody). °· You have a low, dull backache. °· There are regular contractions or uterine tightening. °SEEK IMMEDIATE MEDICAL CARE IF: °You have vaginal bleeding and have placenta previa.  °  °This information is not intended to replace advice given to you by your health care provider. Make sure you discuss any questions you have with your health care provider. °  °Document Released: 03/05/2011 Document Revised: 01/31/2012 Document Reviewed: 05/12/2015 °Elsevier Interactive Patient Education ©2016 Elsevier Inc. ° °Vaginal Bleeding During Pregnancy, First Trimester °A small amount of bleeding (spotting) from the vagina is relatively common in early pregnancy. It usually stops on its own. Various things may cause bleeding or spotting in early pregnancy. Some bleeding may be related to the pregnancy, and some may not. In most cases, the bleeding is normal and is not a problem. However, bleeding can also be a sign of something serious. Be sure to tell your health care provider about any vaginal bleeding right  away. °Some possible causes of vaginal bleeding during the first trimester include: °· Infection or inflammation of the cervix. °· Growths (polyps) on the cervix. °· Miscarriage or threatened miscarriage. °· Pregnancy tissue has developed outside of the uterus and in a fallopian tube (tubal pregnancy). °· Tiny cysts have developed in the uterus instead of pregnancy tissue (molar pregnancy). °HOME CARE INSTRUCTIONS  °Watch your condition for any changes. The following actions may help to lessen any discomfort you are feeling: °· Follow your health care provider's instructions for limiting your activity. If your health care provider orders bed rest, you may need to stay in bed and only get up to use the bathroom. However, your health care provider may allow you to continue light activity. °· If needed, make plans for someone to help with your regular activities and responsibilities while you are on bed rest. °· Keep track of the number of pads you use each day, how often you change pads, and how soaked (saturated) they are. Write this down. °· Do not use tampons. Do not douche. °· Do not have sexual intercourse or orgasms until approved by your health care provider. °· If you pass any tissue from your vagina, save the tissue so you can show it to your health care provider. °· Only take over-the-counter or prescription medicines as directed by your health care provider. °· Do not take aspirin because it can make you bleed. °· Keep all follow-up appointments as directed by your health care provider. °SEEK MEDICAL CARE IF: °· You have any vaginal bleeding during any part of your pregnancy. °· You have cramps or labor pains. °· You have a fever, not   controlled by medicine. °SEEK IMMEDIATE MEDICAL CARE IF:  °· You have severe cramps in your back or belly (abdomen). °· You pass large clots or tissue from your vagina. °· Your bleeding increases. °· You feel light-headed or weak, or you have fainting episodes. °· You have  chills. °· You are leaking fluid or have a gush of fluid from your vagina. °· You pass out while having a bowel movement. °MAKE SURE YOU: °· Understand these instructions. °· Will watch your condition. °· Will get help right away if you are not doing well or get worse. °  °This information is not intended to replace advice given to you by your health care provider. Make sure you discuss any questions you have with your health care provider. °  °Document Released: 08/18/2005 Document Revised: 11/13/2013 Document Reviewed: 07/16/2013 °Elsevier Interactive Patient Education ©2016 Elsevier Inc. ° °

## 2015-10-27 LAB — GC/CHLAMYDIA PROBE AMP (~~LOC~~) NOT AT ARMC
CHLAMYDIA, DNA PROBE: NEGATIVE
Neisseria Gonorrhea: NEGATIVE

## 2015-10-30 DIAGNOSIS — O09299 Supervision of pregnancy with other poor reproductive or obstetric history, unspecified trimester: Secondary | ICD-10-CM | POA: Insufficient documentation

## 2015-12-09 ENCOUNTER — Encounter (HOSPITAL_COMMUNITY): Payer: Self-pay | Admitting: *Deleted

## 2015-12-09 ENCOUNTER — Inpatient Hospital Stay (HOSPITAL_COMMUNITY)
Admission: AD | Admit: 2015-12-09 | Discharge: 2015-12-09 | Disposition: A | Discharge status: Home / Self Care | Payer: Medicaid Other | Source: Ambulatory Visit | Attending: Family Medicine | Admitting: Family Medicine

## 2015-12-09 DIAGNOSIS — A084 Viral intestinal infection, unspecified: Secondary | ICD-10-CM | POA: Insufficient documentation

## 2015-12-09 DIAGNOSIS — Z3A13 13 weeks gestation of pregnancy: Secondary | ICD-10-CM | POA: Diagnosis not present

## 2015-12-09 DIAGNOSIS — O26891 Other specified pregnancy related conditions, first trimester: Secondary | ICD-10-CM | POA: Diagnosis not present

## 2015-12-09 DIAGNOSIS — R112 Nausea with vomiting, unspecified: Secondary | ICD-10-CM | POA: Insufficient documentation

## 2015-12-09 LAB — URINE MICROSCOPIC-ADD ON

## 2015-12-09 LAB — URINALYSIS, ROUTINE W REFLEX MICROSCOPIC
Bilirubin Urine: NEGATIVE
GLUCOSE, UA: NEGATIVE mg/dL
Leukocytes, UA: NEGATIVE
Nitrite: NEGATIVE
PH: 6 (ref 5.0–8.0)
PROTEIN: 30 mg/dL — AB
Specific Gravity, Urine: >1.03 — ABNORMAL HIGH (ref 1.005–1.030)

## 2015-12-09 MED ORDER — FAMOTIDINE IN NACL 20-0.9 MG/50ML-% IV SOLN
20.0000 mg | Freq: Once | INTRAVENOUS | Status: AC
  Administered 2015-12-09: 20 mg via INTRAVENOUS
  Filled 2015-12-09: qty 50

## 2015-12-09 MED ORDER — LACTATED RINGERS IV BOLUS (SEPSIS)
1000.0000 mL | Freq: Once | INTRAVENOUS | Status: AC
  Administered 2015-12-09: 1000 mL via INTRAVENOUS

## 2015-12-09 MED ORDER — ONDANSETRON 8 MG PO TBDP
8.0000 mg | ORAL_TABLET | Freq: Once | ORAL | Status: AC
  Administered 2015-12-09: 8 mg via ORAL
  Filled 2015-12-09: qty 1

## 2015-12-09 MED ORDER — ONDANSETRON HCL 4 MG PO TABS: 4.0000 mg | ORAL_TABLET | Freq: Four times a day (QID) | ORAL | Status: AC

## 2015-12-09 MED ORDER — SIMETHICONE 80 MG PO CHEW
80.0000 mg | CHEWABLE_TABLET | Freq: Once | ORAL | Status: AC
  Administered 2015-12-09: 80 mg via ORAL
  Filled 2015-12-09: qty 1

## 2015-12-09 NOTE — MAU Provider Note (Signed)
History     CSN: [id number]  Arrival date and time: 12/09/15 0025   First Provider Initiated Contact with Patient 12/09/15 0051      Chief Complaint  Patient presents with  . Emesis During Pregnancy   HPI Ms. Aviannah Castoro is a 25 y.o. G3P0110 at [redacted]w[redacted]d who presents to MAU today with complaint of N/V and loose stools since this afternoon. The patient states multiple family members with similar symptoms. She denies fever, abdominal pain or vaginal bleeding. She has not taken anything for her symptoms. She states that she has tried to drink Gatorade, but has been unable to keep anything down this evening. Patient receives prenatal care at [facility] because of history of IUFD with last pregnancy. The patient lives in [place].   OB History    Gravida Para Term Preterm AB TAB SAB Ectopic Multiple Living   0 0      Past Medical History  Diagnosis Date  . GERD (gastroesophageal reflux disease)     Past Surgical History  Procedure Laterality Date  . Colposcopy  05/07/14    Family History  Problem Relation Age of Onset  . Diabetes Paternal Grandmother   . Diabetes Paternal Grandfather     Social History  Substance Use Topics  . Smoking status: Never Smoker   . Smokeless tobacco: None  . Alcohol Use: No    Allergies: No Known Allergies  Prescriptions prior to admission  Medication Sig Dispense Refill Last Dose  . omeprazole (PRILOSEC) 20 MG capsule Take 20 mg by mouth daily.   12/09/2015 at Unknown time  . Prenatal Vit-Fe Fumarate-FA (PRENATAL MULTIVITAMIN) TABS tablet Take 1 tablet by mouth daily at 12 noon.   12/09/2015 at Unknown time    Review of Systems  Constitutional: Negative for fever and malaise/fatigue.  Gastrointestinal: Positive for nausea and vomiting. Negative for abdominal pain, diarrhea and constipation.  Genitourinary:       Neg - vaginal bleeding   Physical Exam   Blood pressure 130/68, pulse 92, temperature 98.4 F (36.9 C),  temperature source Oral, resp. rate 20, height  (1.676 m), weight 232 lb 9.6 oz (105.507 kg), last menstrual period 08/31/2015, SpO2 98 %.  Physical Exam  Nursing note and vitals reviewed. Constitutional: She is oriented to person, place, and time. She appears well-developed and well-nourished. No distress.  HENT:  Head: Normocephalic and atraumatic.  Cardiovascular: Normal rate.   Respiratory: Effort normal.  GI: Soft. She exhibits no distension and no mass. Bowel sounds are decreased. There is no tenderness. There is no rebound and no guarding.  Neurological: She is alert and oriented to person, place, and time.  Skin: Skin is warm and dry. No erythema.  Psychiatric: She has a normal mood and affect.    Results for orders placed or performed during the hospital encounter of 12/09/15 (from the past 24 hour(s))  Urinalysis, Routine w reflex microscopic (not at [facility])     Status: Abnormal   Collection Time: 12/09/15 12:40 AM  Result Value Ref Range   Color, Urine YELLOW YELLOW   APPearance CLEAR CLEAR   Specific Gravity, Urine >1.030 (H) 1.005 - 1.030   pH 6.0 5.0 - 8.0   Glucose, UA NEGATIVE NEGATIVE mg/dL   Hgb urine dipstick TRACE (A) NEGATIVE   Bilirubin Urine NEGATIVE NEGATIVE   Ketones, ur >80 (A) NEGATIVE mg/dL   Protein, ur 30 (A) NEGATIVE mg/dL   Nitrite NEGATIVE NEGATIVE  Leukocytes, UA NEGATIVE NEGATIVE  Urine microscopic-add on     Status: Abnormal   Collection Time: 12/09/15 12:40 AM  Result Value Ref Range   Squamous Epithelial / LPF 0-5 (A) NONE SEEN   WBC, UA 0-5 0 - 5 WBC/hpf   RBC / HPF 0-5 0 - 5 RBC/hpf   Bacteria, UA RARE (A) NONE SEEN    MAU Course  Procedures None  MDM Reviewed [place] and note from visit in MAU on 10/26/15. Confirmed SIUP.  FHR - 154 bpm with doppler UA today shows significant dehydration ODT Zofran given in MAU.  IV LR with pepcid for heartburn given due to dehydration. Patient will attempt PO fluids. Patient complains of  bloating. Simethicone chewable given.  Patient able to tolerate PO while in MAU without emesis Assessment and Plan  A: SIUP at [redacted]w[redacted]d Viral gastroenteritis  P: Discharge home Rx for Zofran given to patient Second trimester precautions discussed Diet for N/V/D included in AVS Patient advised to follow-up with [facility] as scheduled for routine prenatal care or sooner PRN Patient may return to MAU as needed or if her condition were to change or worsen   Marny Lowenstein, PA-C  12/09/2015, 3:02 AM

## 2015-12-09 NOTE — MAU Note (Signed)
Pt states that she has had n/v/d since 2pm. States that she watched niece over weekend who had diarrhea-states mom and sister also have it. Has not been able to keep anything down all day. Tried sipping on gatorade around 2330 and threw it up.

## 2015-12-09 NOTE — Discharge Instructions (Signed)
Viral Gastroenteritis °Viral gastroenteritis is also called stomach flu. This illness is caused by a certain type of germ (virus). It can cause sudden watery poop (diarrhea) and throwing up (vomiting). This can cause you to lose body fluids (dehydration). This illness usually lasts for 3 to 8 days. It usually goes away on its own. °HOME CARE  °· Drink enough fluids to keep your pee (urine) clear or pale yellow. Drink small amounts of fluids often. °· Ask your doctor how to replace body fluid losses (rehydration). °· Avoid: °¨ Foods high in sugar. °¨ Alcohol. °¨ Bubbly (carbonated) drinks. °¨ Tobacco. °¨ Juice. °¨ Caffeine drinks. °¨ Very hot or cold fluids. °¨ Fatty, greasy foods. °¨ Eating too much at one time. °¨ Dairy products until 24 to 48 hours after your watery poop stops. °· You may eat foods with active cultures (probiotics). They can be found in some yogurts and supplements. °· Wash your hands well to avoid spreading the illness. °· Only take medicines as told by your doctor. Do not give aspirin to children. Do not take medicines for watery poop (antidiarrheals). °· Ask your doctor if you should keep taking your regular medicines. °· Keep all doctor visits as told. °GET HELP RIGHT AWAY IF:  °· You cannot keep fluids down. °· You do not pee at least once every 6 to 8 hours. °· You are short of breath. °· You see blood in your poop or throw up. This may look like coffee grounds. °· You have belly (abdominal) pain that gets worse or is just in one small spot (localized). °· You keep throwing up or having watery poop. °· You have a fever. °· The patient is a child younger than 3 months, and he or she has a fever. °· The patient is a child older than 3 months, and he or she has a fever and problems that do not go away. °· The patient is a child older than 3 months, and he or she has a fever and problems that suddenly get worse. °· The patient is a baby, and he or she has no tears when crying. °MAKE SURE YOU:    °· Understand these instructions. °· Will watch your condition. °· Will get help right away if you are not doing well or get worse. °  °This information is not intended to replace advice given to you by your health care provider. Make sure you discuss any questions you have with your health care provider. °  °Document Released: 04/26/2008 Document Revised: 01/31/2012 Document Reviewed: 08/25/2011 °Elsevier Interactive Patient Education ©2016 [employer] ° °Food Choices to Help Relieve Diarrhea, Adult °When you have diarrhea, the foods you eat and your eating habits are very important. Choosing the right foods and drinks can help relieve diarrhea. Also, because diarrhea can last up to 7 days, you need to replace lost fluids and electrolytes (such as sodium, potassium, and chloride) in order to help prevent dehydration.  °WHAT GENERAL GUIDELINES DO I NEED TO FOLLOW? °· Slowly drink 1 cup (8 oz) of fluid for each episode of diarrhea. If you are getting enough fluid, your urine will be clear or pale yellow. °· Eat starchy foods. Some good choices include white rice, white toast, pasta, low-fiber cereal, baked potatoes (without the skin), saltine crackers, and bagels. °· Avoid large servings of any cooked vegetables. °· Limit fruit to two servings per day. A serving is ½ cup or 1 small piece. °· Choose foods with less than 2   g of fiber per serving. °· Limit fats to less than 8 tsp (38 g) per day. °· Avoid fried foods. °· Eat foods that have probiotics in them. Probiotics can be found in certain dairy products. °· Avoid foods and beverages that may increase the speed at which food moves through the stomach and intestines (gastrointestinal tract). Things to avoid include: °¨ High-fiber foods, such as dried fruit, raw fruits and vegetables, nuts, seeds, and whole grain foods. °¨ Spicy foods and high-fat foods. °¨ Foods and beverages sweetened with high-fructose corn syrup, honey, or sugar alcohols such as xylitol,  sorbitol, and mannitol. °WHAT FOODS ARE RECOMMENDED? °Grains °White rice. White, French, or pita breads (fresh or toasted), including plain rolls, buns, or bagels. White pasta. Saltine, soda, or graham crackers. Pretzels. Low-fiber cereal. Cooked cereals made with water (such as cornmeal, farina, or cream cereals). Plain muffins. Matzo. Melba toast. Zwieback.  °Vegetables °Potatoes (without the skin). Strained tomato and vegetable juices. Most well-cooked and canned vegetables without seeds. Tender lettuce. °Fruits °Cooked or canned applesauce, apricots, cherries, fruit cocktail, grapefruit, peaches, pears, or plums. Fresh bananas, apples without skin, cherries, grapes, cantaloupe, grapefruit, peaches, oranges, or plums.  °Meat and Other Protein Products °Baked or boiled chicken. Eggs. Tofu. Fish. Seafood. Smooth peanut butter. Ground or well-cooked tender beef, ham, veal, lamb, pork, or poultry.  °Dairy °Plain yogurt, kefir, and unsweetened liquid yogurt. Lactose-free milk, buttermilk, or soy milk. Plain hard cheese. °Beverages °Sport drinks. Clear broths. Diluted fruit juices (except prune). Regular, caffeine-free sodas such as ginger ale. Water. Decaffeinated teas. Oral rehydration solutions. Sugar-free beverages not sweetened with sugar alcohols. °Other °Bouillon, broth, or soups made from recommended foods.  °The items listed above may not be a complete list of recommended foods or beverages. Contact your dietitian for more options. °WHAT FOODS ARE NOT RECOMMENDED? °Grains °Whole grain, whole wheat, bran, or rye breads, rolls, pastas, crackers, and cereals. Wild or brown rice. Cereals that contain more than 2 g of fiber per serving. Corn tortillas or taco shells. Cooked or dry oatmeal. Granola. Popcorn. °Vegetables °Raw vegetables. Cabbage, broccoli, Brussels sprouts, artichokes, baked beans, beet greens, corn, kale, legumes, peas, sweet potatoes, and yams. Potato skins. Cooked spinach and  cabbage. °Fruits °Dried fruit, including raisins and dates. Raw fruits. Stewed or dried prunes. Fresh apples with skin, apricots, mangoes, pears, raspberries, and strawberries.  °Meat and Other Protein Products °Chunky peanut butter. Nuts and seeds. Beans and lentils. Bacon.  °Dairy °High-fat cheeses. Milk, chocolate milk, and beverages made with milk, such as milk shakes. Cream. Ice cream. °Sweets and Desserts °Sweet rolls, doughnuts, and sweet breads. Pancakes and waffles. °Fats and Oils °Butter. Cream sauces. Margarine. Salad oils. Plain salad dressings. Olives. Avocados.  °Beverages °Caffeinated beverages (such as coffee, tea, soda, or energy drinks). Alcoholic beverages. Fruit juices with pulp. Prune juice. Soft drinks sweetened with high-fructose corn syrup or sugar alcohols. °Other °Coconut. Hot sauce. Chili powder. Mayonnaise. Gravy. Cream-based or milk-based soups.  °The items listed above may not be a complete list of foods and beverages to avoid. Contact your dietitian for more information. °WHAT SHOULD I DO IF I BECOME DEHYDRATED? °Diarrhea can sometimes lead to dehydration. Signs of dehydration include dark urine and dry mouth and skin. If you think you are dehydrated, you should rehydrate with an oral rehydration solution. These solutions can be purchased at pharmacies, retail stores, or online.  °Drink ½-1 cup (120-240 mL) of oral rehydration solution each time you have an episode of diarrhea. If drinking this   amount makes your diarrhea worse, try drinking smaller amounts more often. For example, drink 1-3 tsp (5-15 mL) every 5-10 minutes.  °A general rule for staying hydrated is to drink 1½-2 L of fluid per day. Talk to your health care provider about the specific amount you should be drinking each day. Drink enough fluids to keep your urine clear or pale yellow. °  °This information is not intended to replace advice given to you by your health care provider. Make sure you discuss any questions you  have with your health care provider. °  °Document Released: 01/29/2004 Document Revised: 11/29/2014 Document Reviewed: 10/01/2013 °Elsevier Interactive Patient Education ©2016 [employer] Inc. ° °

## 2016-02-12 DIAGNOSIS — O99210 Obesity complicating pregnancy, unspecified trimester: Secondary | ICD-10-CM | POA: Insufficient documentation

## 2016-02-12 DIAGNOSIS — F419 Anxiety disorder, unspecified: Secondary | ICD-10-CM | POA: Insufficient documentation

## 2016-06-10 DIAGNOSIS — Z98891 History of uterine scar from previous surgery: Secondary | ICD-10-CM | POA: Insufficient documentation

## 2016-06-21 DIAGNOSIS — L02219 Cutaneous abscess of trunk, unspecified: Secondary | ICD-10-CM | POA: Insufficient documentation

## 2016-06-30 DIAGNOSIS — O8600 Infection of obstetric surgical wound, unspecified: Secondary | ICD-10-CM | POA: Insufficient documentation

## 2016-07-01 DIAGNOSIS — Z4889 Encounter for other specified surgical aftercare: Secondary | ICD-10-CM | POA: Insufficient documentation

## 2016-08-30 ENCOUNTER — Encounter (HOSPITAL_COMMUNITY): Payer: Self-pay

## 2016-10-14 IMAGING — US US OB LIMITED
1 series · 12 of 12 positions shown · non-contrast
Comparison: none

[Series 1: us ob limited · 12 acquisitions, 12 frames shown]
[im 1/12]
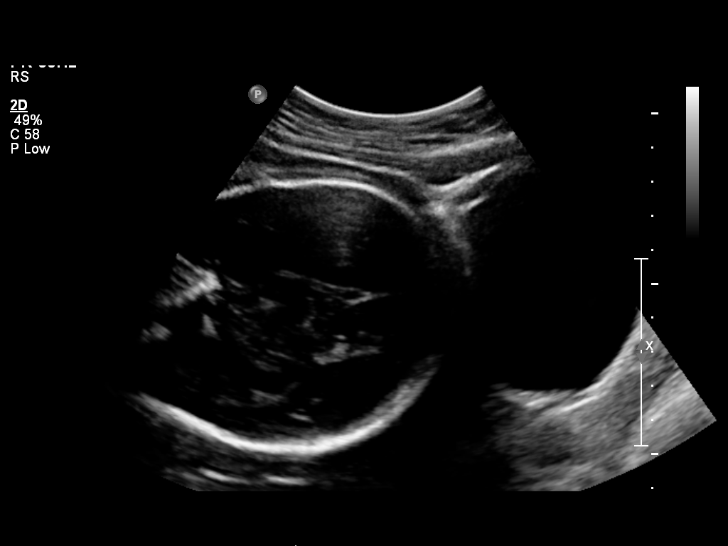
[im 2/12]
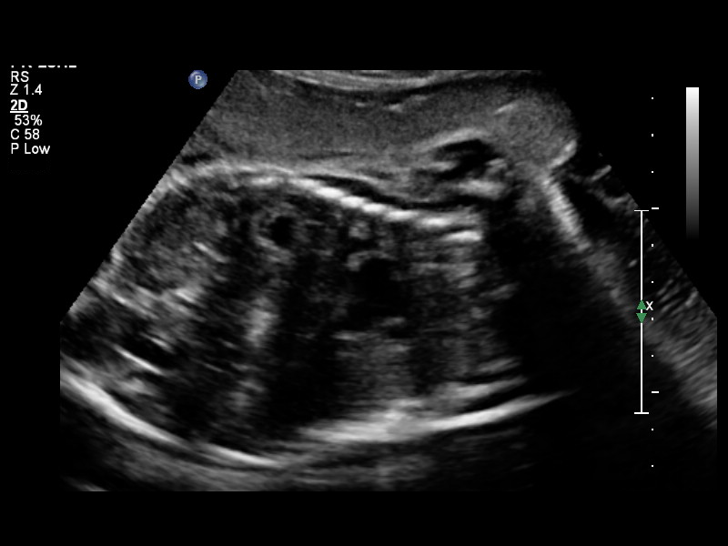
[im 3/12]
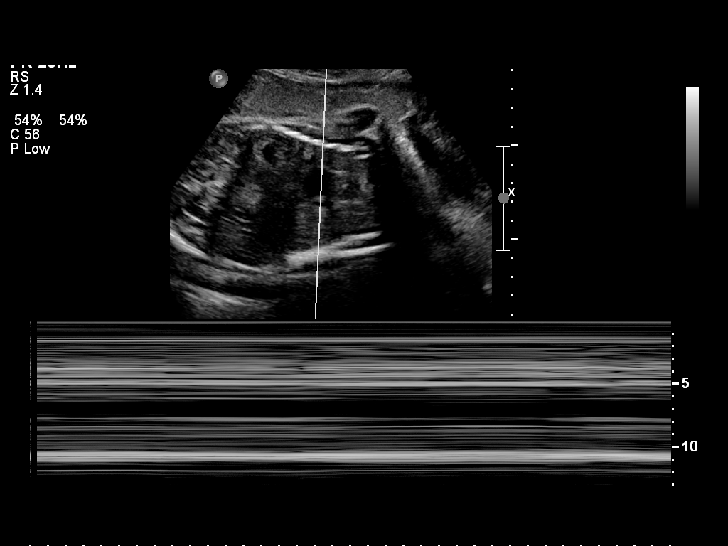
[im 4/12]
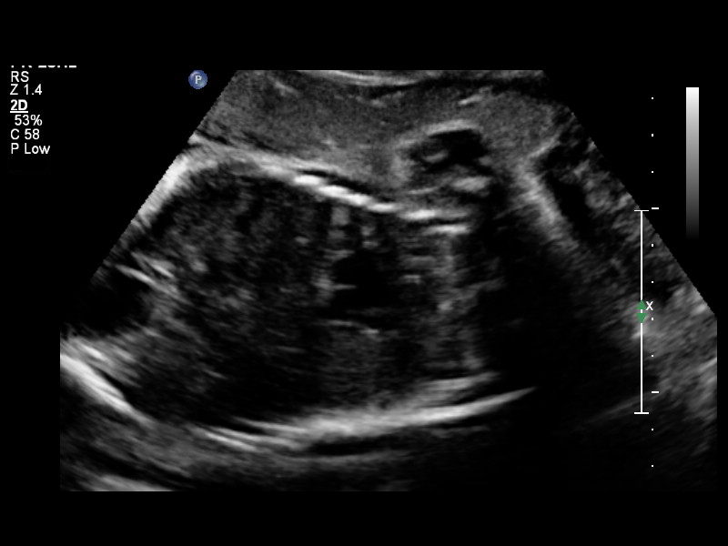
[im 5/12]
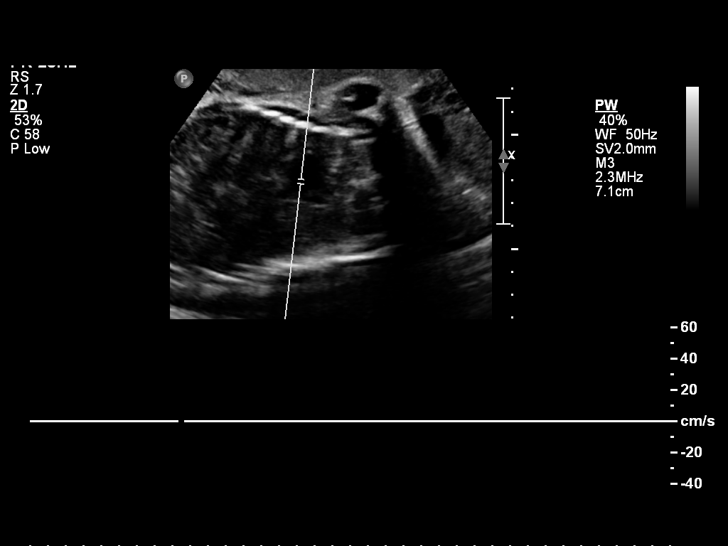
[im 6/12]
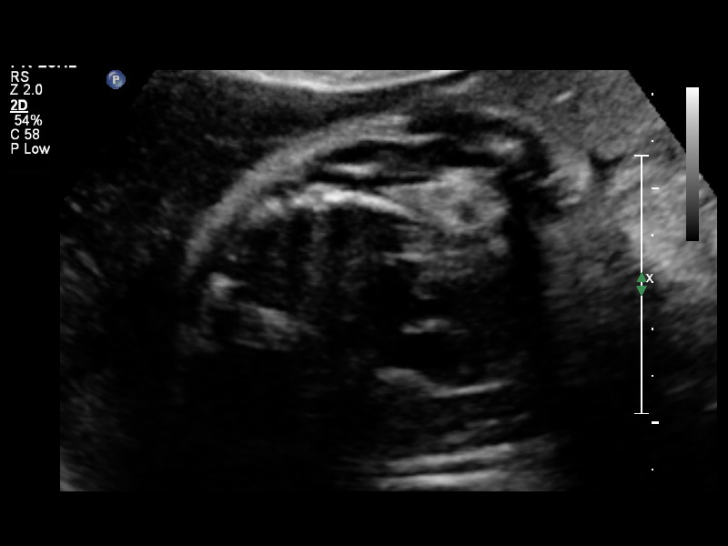
[im 7/12]
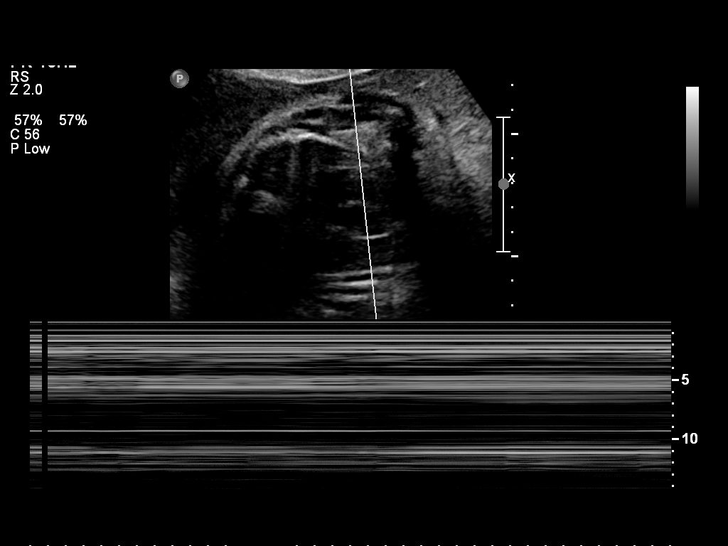
[im 8/12]
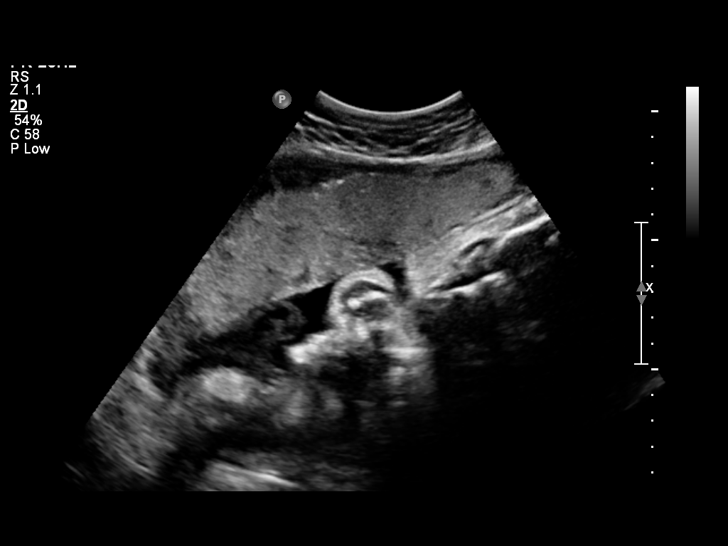
[im 9/12]
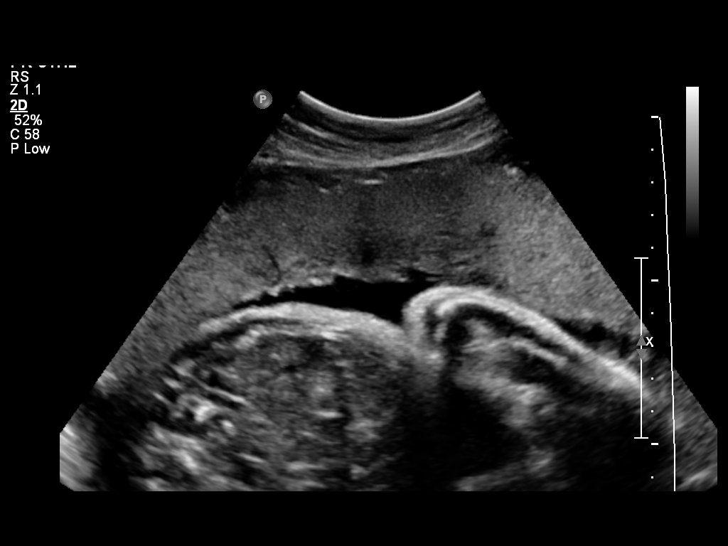
[im 10/12]
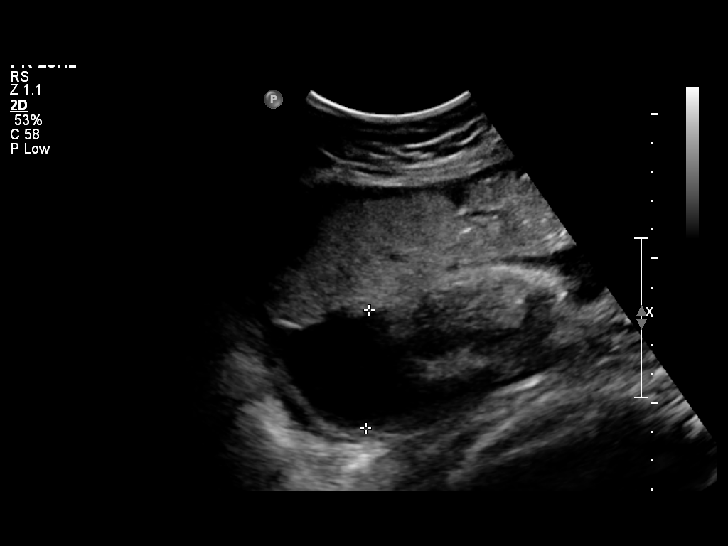
[im 11/12]
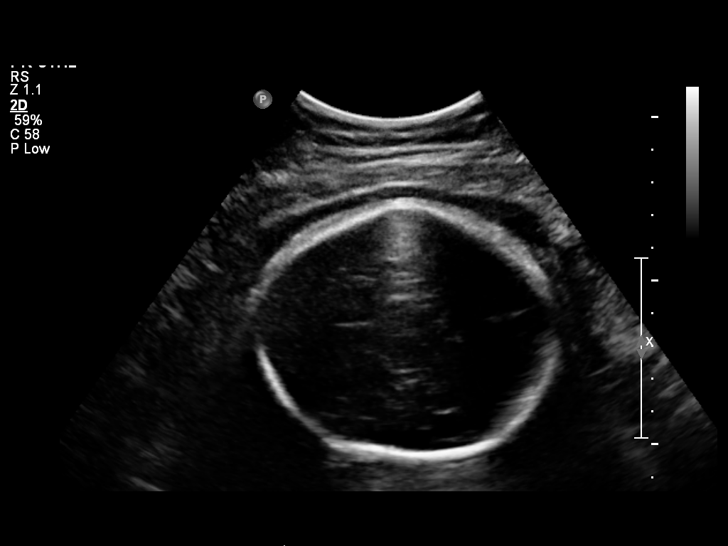
[im 12/12]
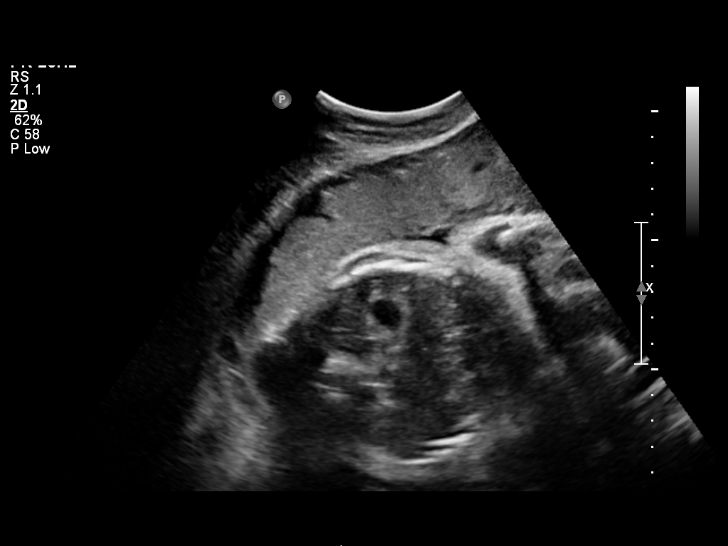

[12 of 12 positions shown; findings below may reference images not displayed]

OBSTETRICS REPORT
                      (Signed Final 11/23/2014 [DATE])

Service(s) Provided

 [HOSPITAL]                                         76815.0
Indications

 Decreased fetal movement (no FHT's on exam)
 32 weeks gestation of pregnancy
Fetal Evaluation

 Num Of Fetuses:    1
 Cardiac Activity:  Absent
 Presentation:      Cephalic
 Placenta:          Anterior, above cervical os

 Amniotic Fluid
 AFI FV:      Subjectively within normal limits
                                             Larg Pckt:     4.1  cm
Gestational Age

 Clinical EDD:  32w 1d                                        EDD:   01/15/15
 Best:          32w 1d     Det. By:  Clinical EDD             EDD:   01/15/15
Impression

 SIUP at 32+1 weeks
 Fetal demise
 Normal amniotic fluid volume

 ARA with us.  Please do not hesitate to contact

## 2016-12-14 DIAGNOSIS — K432 Incisional hernia without obstruction or gangrene: Secondary | ICD-10-CM | POA: Insufficient documentation

## 2017-08-30 IMAGING — US US OB COMP LESS 14 WK
1 series · 13 of 28 positions shown · non-contrast
Comparison: None.

CLINICAL DATA: Pregnant patient with abdominal pain.  Beta HCG 470.

EXAM:
OBSTETRIC <14 WK US AND TRANSVAGINAL OB US
TECHNIQUE: Both transabdominal and transvaginal ultrasound examinations were
performed for complete evaluation of the gestation as well as the
maternal uterus, adnexal regions, and pelvic cul-de-sac.
Transvaginal technique was performed to assess early pregnancy.

[Series 1: us ob comp less 14 wk · 0.21mm/px · 40 acquisitions, 13 frames shown]
[im 2/40]
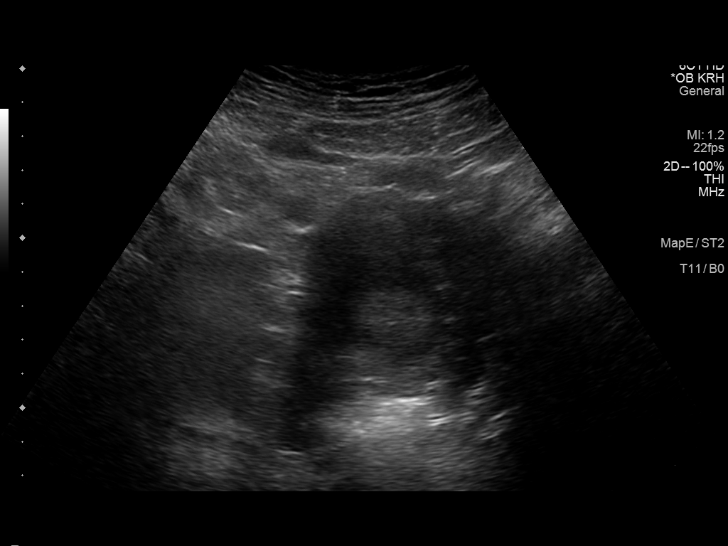
[im 5/40]
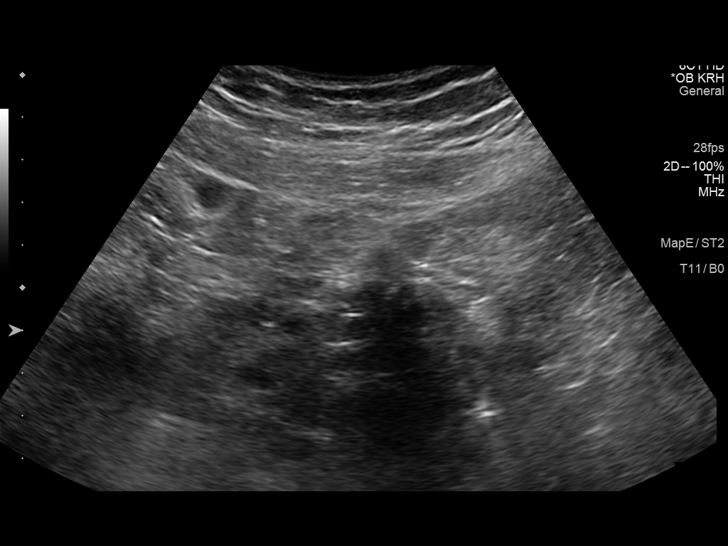
[im 8/40]
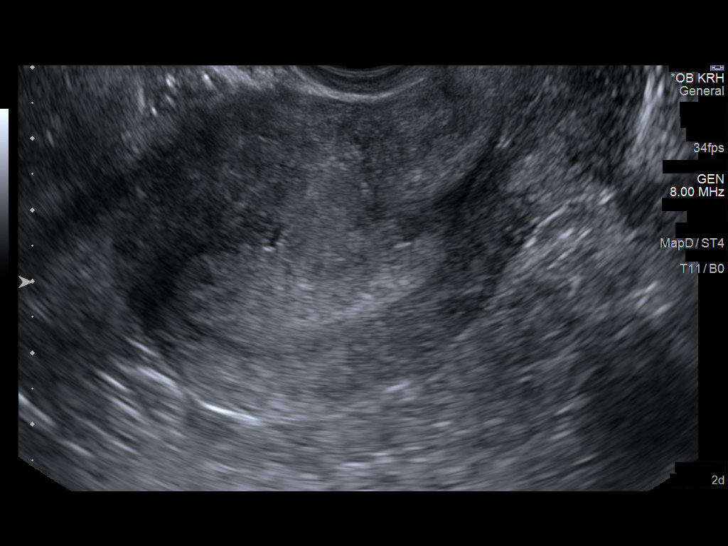
[im 11/40]
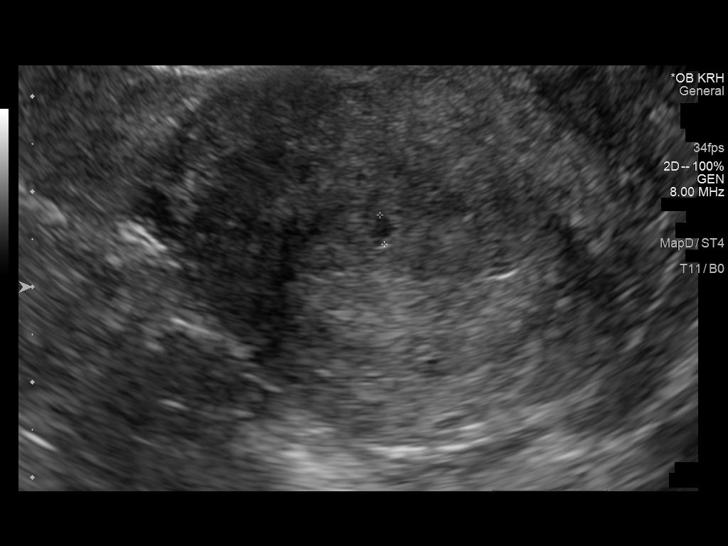
[im 14/40]
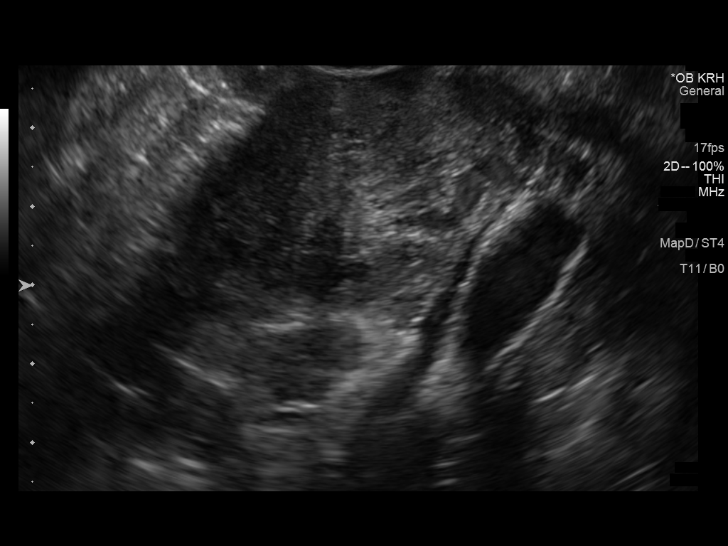
[im 16/40]
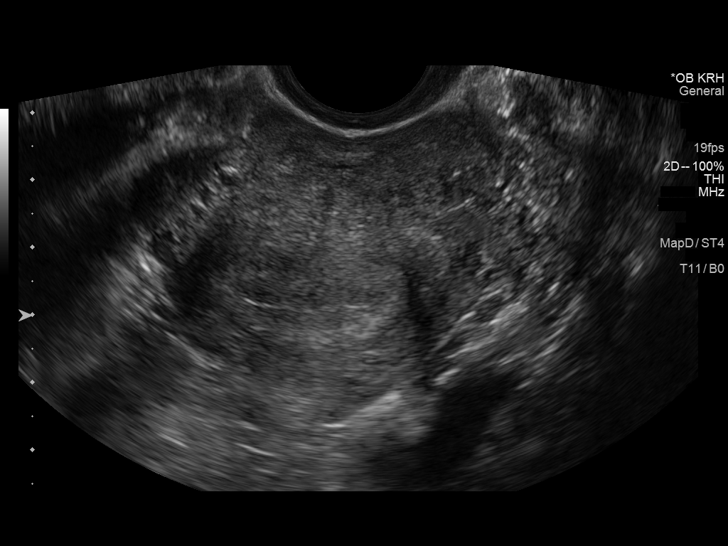
[im 21/40]
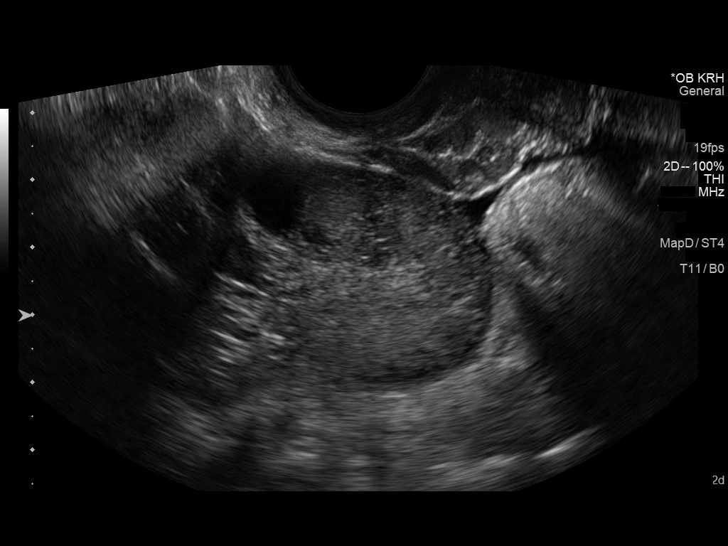
[im 24/40]
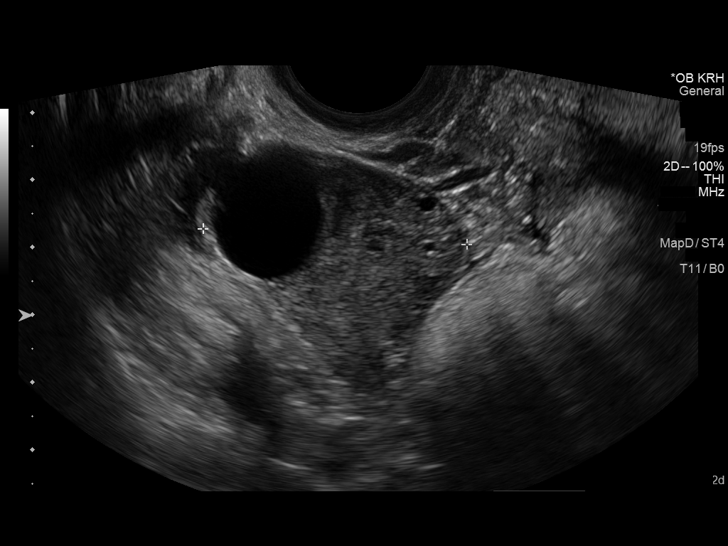
[im 27/40]
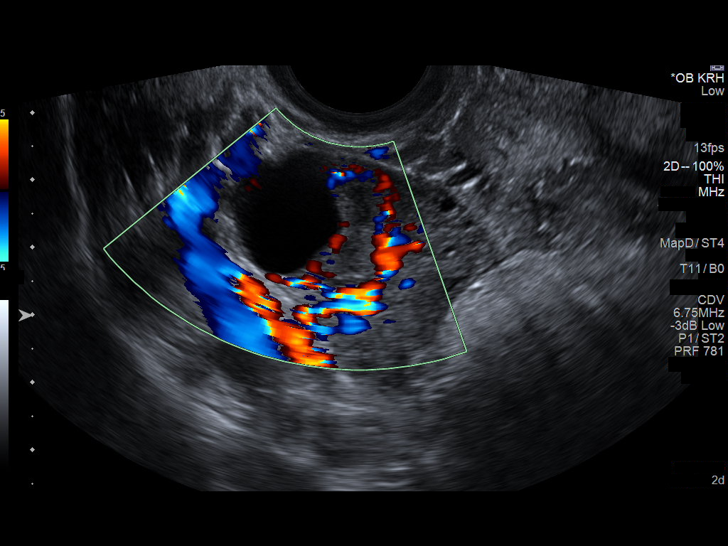
[im 29/40]
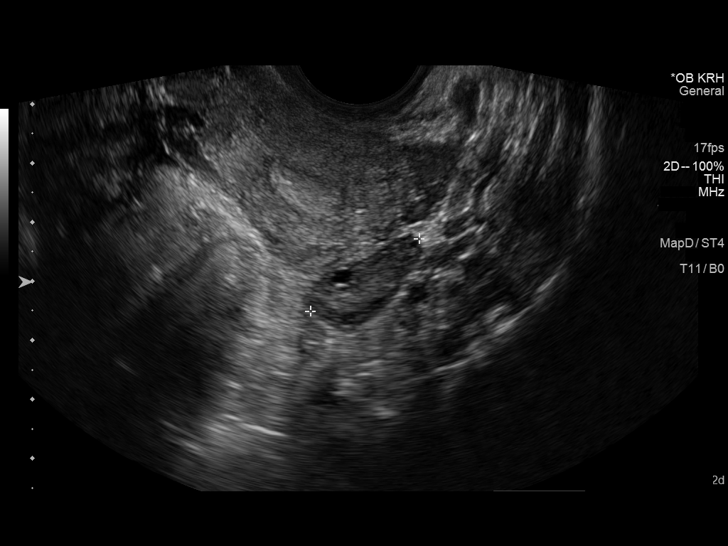
[im 32/40]
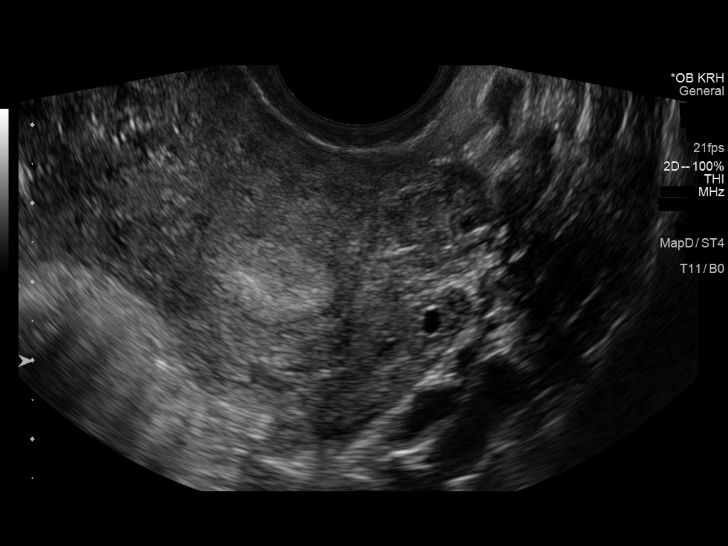
[im 35/40]
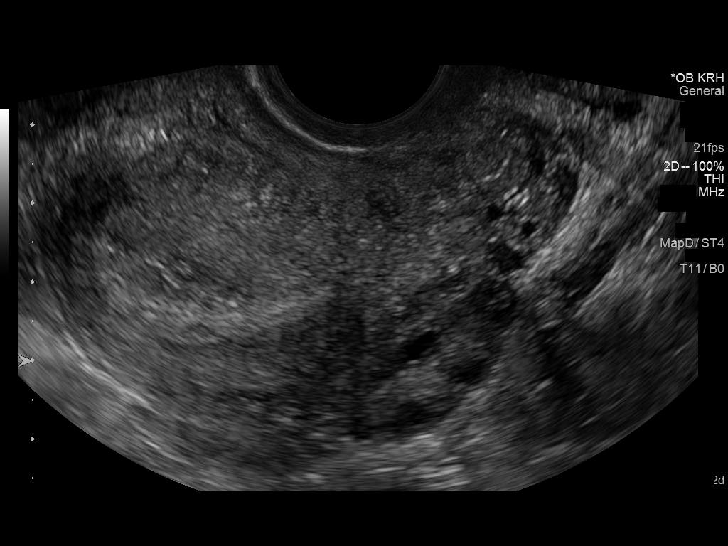
[im 38/40]
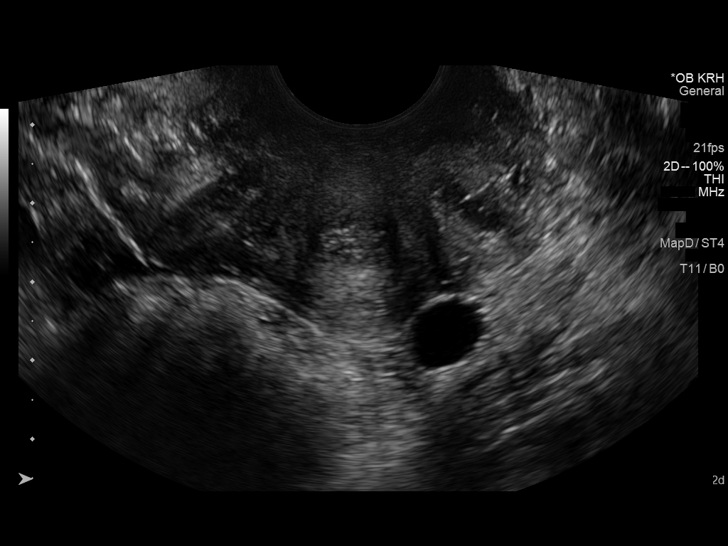

[13 of 28 positions shown; findings below may reference images not displayed]

FINDINGS: Intrauterine gestational sac: Questionable small intrauterine
gestational sac.

Yolk sac:  Not present

Embryo:  Not present.

Cardiac Activity: Not present.

MSD: 2.8  mm   5 w   0 d   US EDC: 06/08/2016

Maternal uterus/adnexae: Thickened endometrium containing a probable
early intrauterine gestational sac. The right ovary measures 3.9 x
4.8 x 2.9 cm and contains a corpus luteal cyst. Left ovary is normal
measuring 2.2 x 1.2 x 2.8 cm. There is an exophytic or paraovarian
cysts measuring 1.5 cm. Trace pelvic free fluid.
IMPRESSION: Probable early intrauterine gestational sac, but no yolk sac, fetal
pole, or cardiac activity yet visualized. Recommend follow-up
quantitative B-HCG levels and follow-up US in 14 days to confirm and
assess viability. This recommendation follows SRU consensus
guidelines: Diagnostic Criteria for Nonviable Pregnancy Early in the
First Trimester. N Engl J Med 1990; [DATE].

## 2017-09-18 ENCOUNTER — Emergency Department (HOSPITAL_BASED_OUTPATIENT_CLINIC_OR_DEPARTMENT_OTHER)
Admission: EM | Admit: 2017-09-18 | Discharge: 2017-09-19 | Disposition: A | Discharge status: Home / Self Care | Payer: Medicaid Other | Attending: Emergency Medicine | Admitting: Emergency Medicine

## 2017-09-18 ENCOUNTER — Encounter (HOSPITAL_BASED_OUTPATIENT_CLINIC_OR_DEPARTMENT_OTHER): Payer: Self-pay | Admitting: Emergency Medicine

## 2017-09-18 DIAGNOSIS — J029 Acute pharyngitis, unspecified: Secondary | ICD-10-CM | POA: Diagnosis present

## 2017-09-18 DIAGNOSIS — Z79899 Other long term (current) drug therapy: Secondary | ICD-10-CM | POA: Insufficient documentation

## 2017-09-18 DIAGNOSIS — J069 Acute upper respiratory infection, unspecified: Secondary | ICD-10-CM | POA: Diagnosis not present

## 2017-09-18 DIAGNOSIS — H9203 Otalgia, bilateral: Secondary | ICD-10-CM | POA: Insufficient documentation

## 2017-09-18 DIAGNOSIS — R5383 Other fatigue: Secondary | ICD-10-CM | POA: Diagnosis not present

## 2017-09-18 LAB — RAPID STREP SCREEN (MED CTR MEBANE ONLY): Streptococcus, Group A Screen (Direct): NEGATIVE

## 2017-09-18 IMAGING — US US OB TRANSVAGINAL
1 series · 15 of 28 positions shown · non-contrast
Comparison: None.

CLINICAL DATA: Vaginal bleeding. Patient is 8 weeks 0 days pregnant
by last menstrual period.

EXAM:
TRANSVAGINAL OB ULTRASOUND
TECHNIQUE: Transvaginal ultrasound was performed for complete evaluation of the
gestation as well as the maternal uterus, adnexal regions, and
pelvic cul-de-sac.

[Series 1: us ob transvaginal · 15 of 36 slices shown]
[im 1/36]
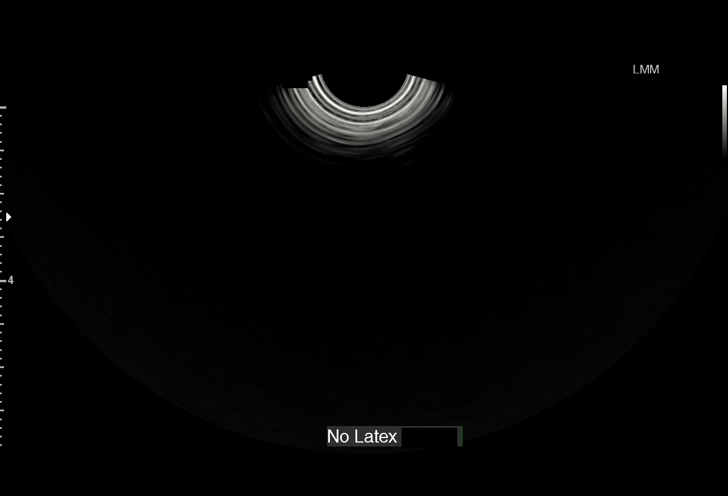
[im 3/36]
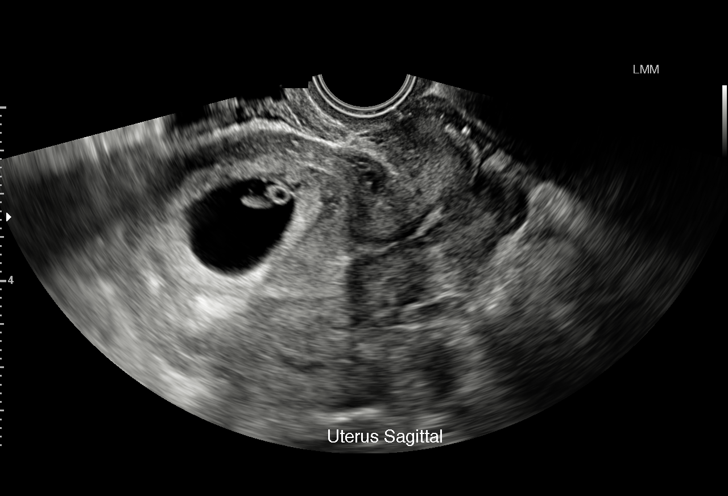
[im 6/36]
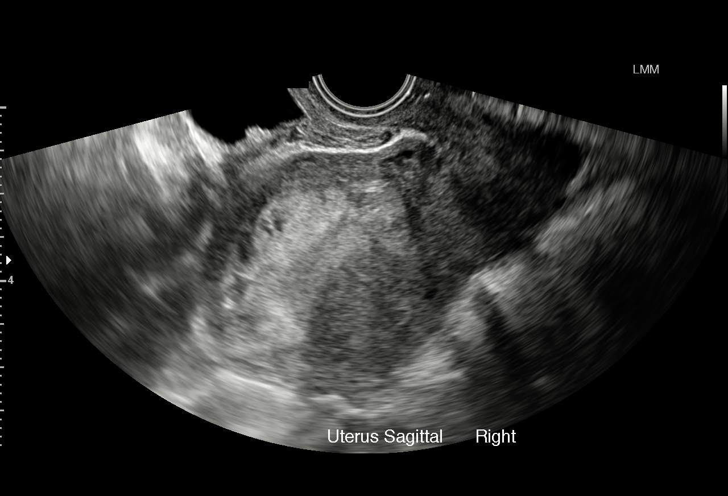
[im 8/36]
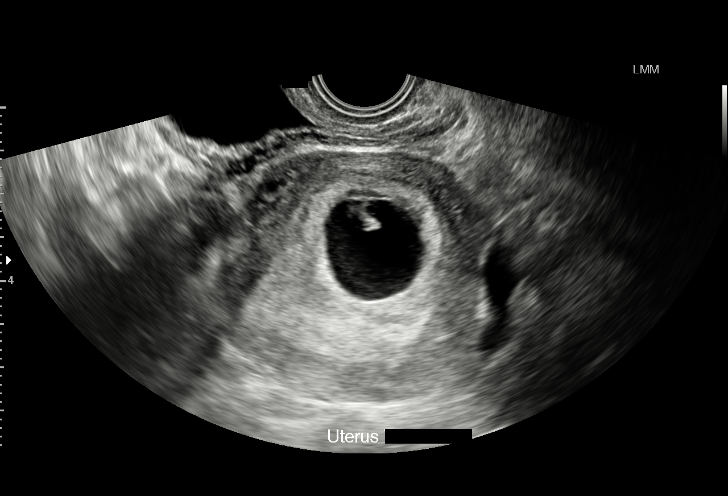
[im 11/36]
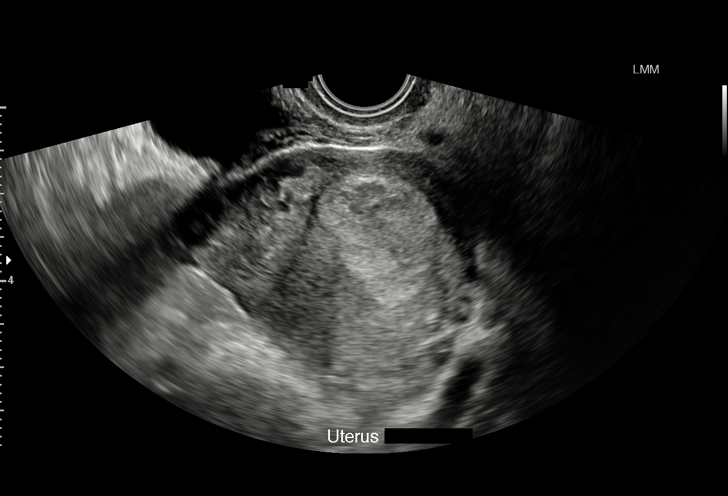
[im 13/36]
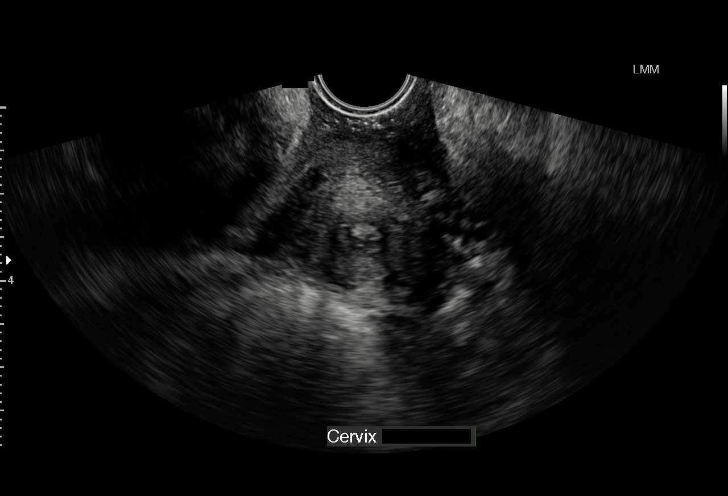
[im 16/36]
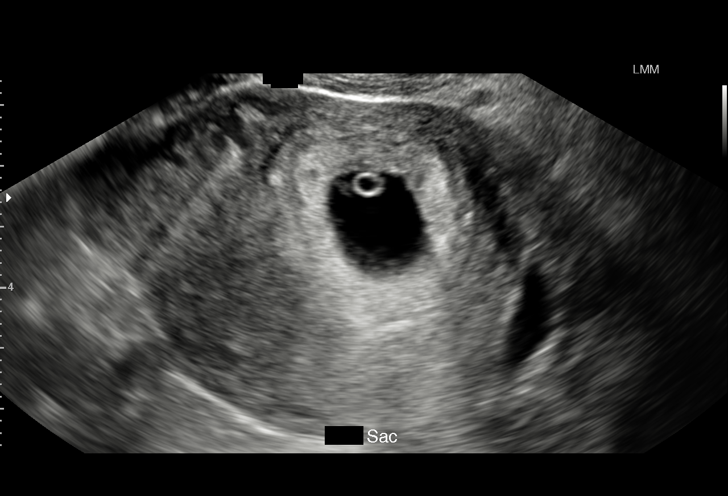
[im 19/36]
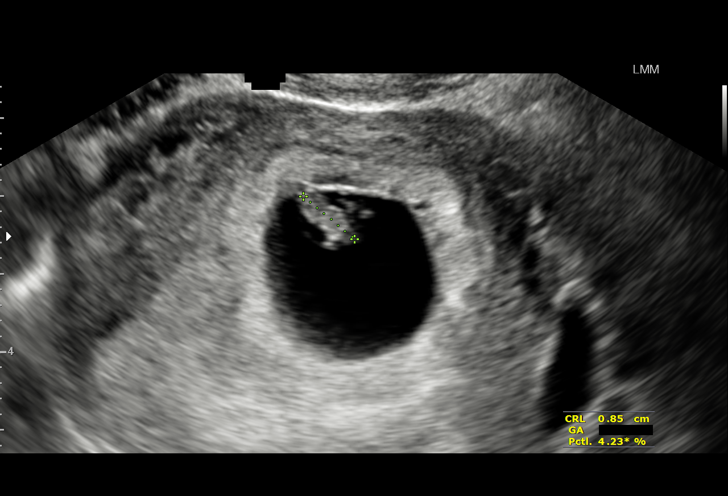
[im 20/36]
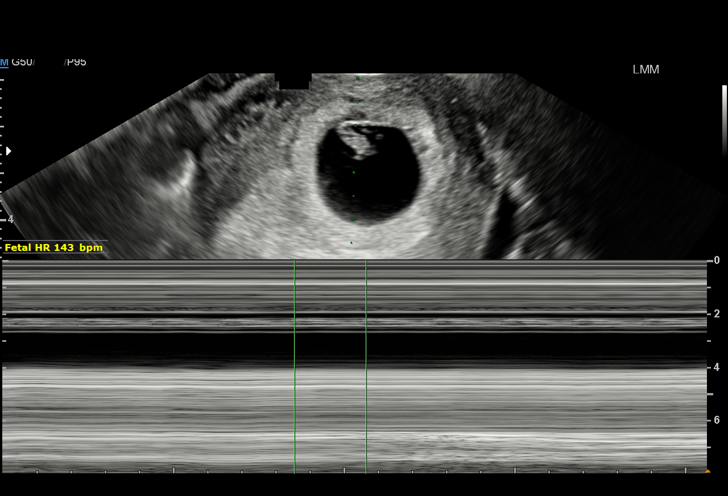
[im 23/36]
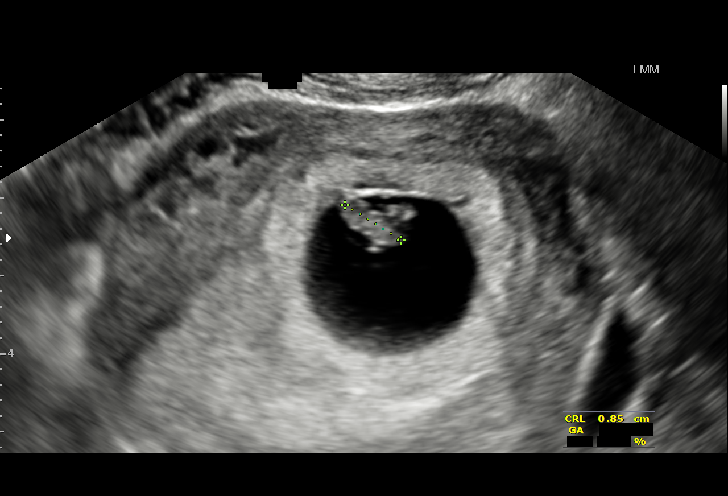
[im 25/36]
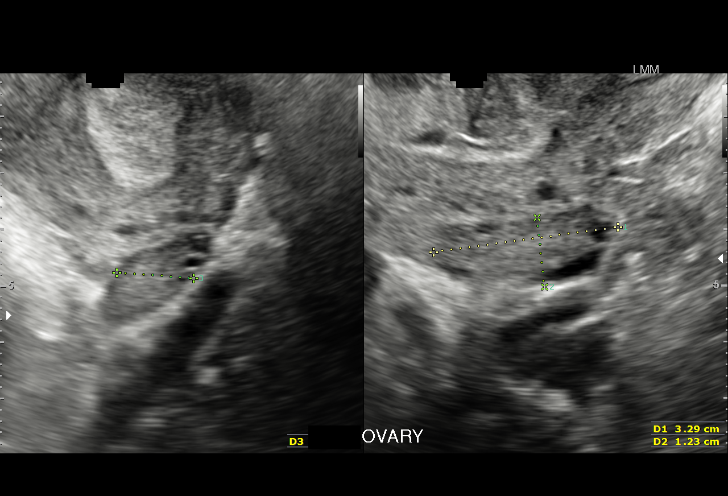
[im 28/36]
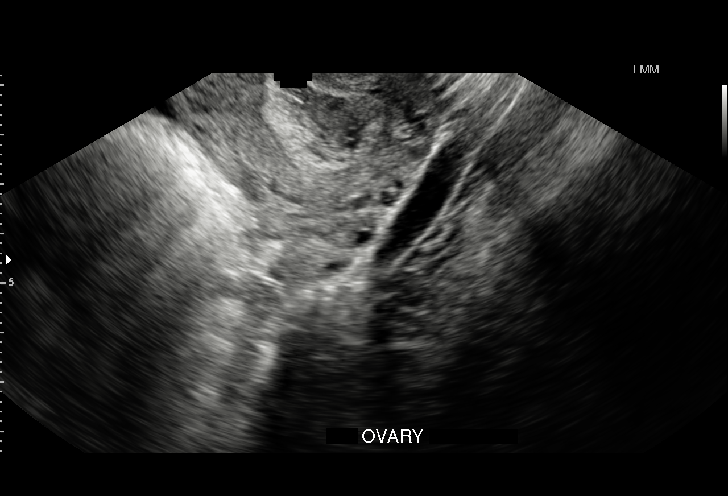
[im 30/36]
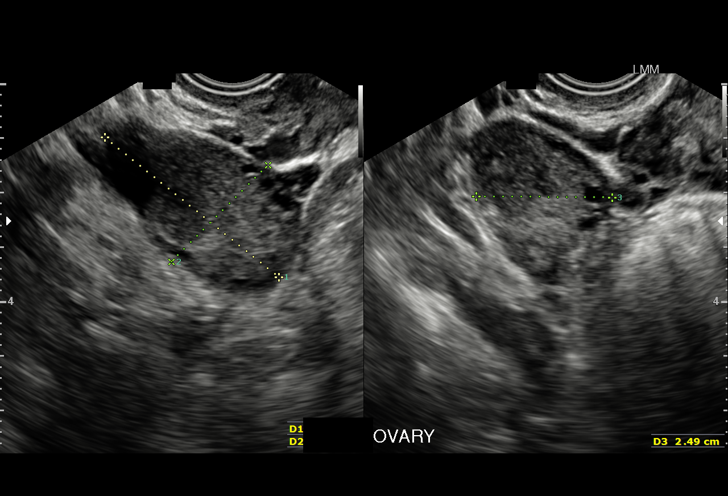
[im 33/36]
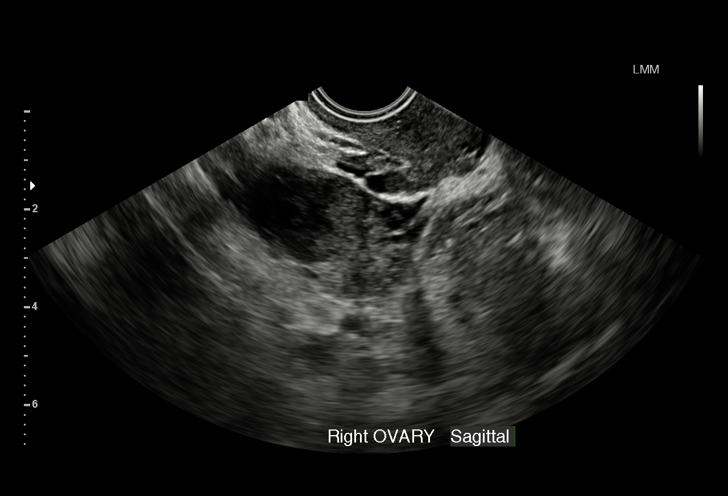
[im 36/36]
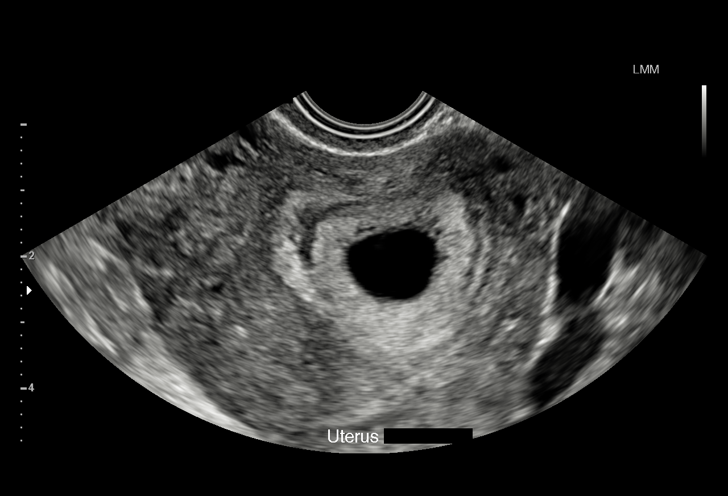

[15 of 28 positions shown; findings below may reference images not displayed]

FINDINGS: Intrauterine gestational sac: Visualized/normal in shape.

Yolk sac:  Visualized

Embryo:  Visualized

Cardiac Activity: Visualized

Heart Rate: 143 bpm

CRL:   8.7 mm  mm   6 w 6 d                  US EDC: 06/14/2016

There is a small subchorionic hemorrhage, best seen on images 36 and
37.

Maternal uterus/adnexae: Normal ovaries visualized.
IMPRESSION: Single live intrauterine pregnancy corresponding to 6 weeks and 6
days gestation.

Small subchorionic hemorrhage.

## 2017-09-18 MED ORDER — ACETAMINOPHEN 325 MG PO TABS
650.0000 mg | ORAL_TABLET | Freq: Once | ORAL | Status: AC
  Administered 2017-09-18: 650 mg via ORAL
  Filled 2017-09-18: qty 2

## 2017-09-18 NOTE — ED Triage Notes (Signed)
PT presents with flu like symptoms since Thursday

## 2017-09-19 MED ORDER — LIDOCAINE VISCOUS 2 % MT SOLN: 15.0000 mL | OROMUCOSAL | 0 refills | Status: AC | PRN

## 2017-09-19 MED ORDER — LORATADINE 10 MG PO TABS: 10.0000 mg | ORAL_TABLET | Freq: Every day | ORAL | 0 refills | Status: AC

## 2017-09-19 NOTE — ED Provider Notes (Signed)
[facility] EMERGENCY DEPARTMENT Provider Note   CSN: [id number] Arrival date & time: 09/18/17  2001     History   Chief Complaint Chief Complaint  Patient presents with  . Influenza    HPI Margaret Russell is a 26 y.o. female.  HPI  Margaret Russell is a 26 year old female with a history of GERD who presents emergency department for evaluation of sore throat and chills.  She states that sore throat began 3 days ago and has gradually worsened.  Pain is a 10/10 in severity, constant and described as scratchy and aching.  Her pain is worsened when she has a dry throat or with swallowing.  She has tried Tylenol Cold and flu as well as ibuprofen for her symptoms with mild relief.  States that she has also had a chills for the past 2 days, but has not checked her temperature at home.  Also endorses dull, aching bilateral ear pain and fatigue.  Denies body aches, congestion, otorrhea, tinnitus, hearing loss, cough, shortness of breath, abdominal pain, nausea/vomiting.  No known sick contacts at home.  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 Able to tolerate PO fluids without difficulty.  Past Medical History:  Diagnosis Date  . GERD (gastroesophageal reflux disease)     Patient Active Problem List   Diagnosis Date Noted  . Decreased fetal movement in pregnancy   . [redacted] weeks gestation of pregnancy   . Postpartum care following vaginal delivery 11/22/2014  . IUFD (intrauterine fetal death) 2014/12/06    Past Surgical History:  Procedure Laterality Date  . COLPOSCOPY  05/07/14    OB History    Gravida Para Term Preterm AB Living   3 1   1 1  0   SAB TAB Ectopic Multiple Live Births     1   0         Home Medications    Prior to Admission medications   Medication Sig Start Date End Date Taking? Authorizing Provider  lidocaine (XYLOCAINE) 2 % solution Use as directed 15 mLs in the mouth or throat as needed for mouth  pain. 09/19/17   Kellie Shropshire, PA-C  loratadine (CLARITIN) 10 MG tablet Take 1 tablet (10 mg total) by mouth daily. 09/19/17   Kellie Shropshire, PA-C  omeprazole (PRILOSEC) 20 MG capsule Take 20 mg by mouth daily.    [provider]  ondansetron (ZOFRAN) 4 MG tablet Take 1 tablet (4 mg total) by mouth every 6 (six) hours. 12/09/15   Marny Lowenstein, PA-C  Prenatal Vit-Fe Fumarate-FA (PRENATAL MULTIVITAMIN) TABS tablet Take 1 tablet by mouth daily at 12 noon.    [provider]    Family History Family History  Problem Relation Age of Onset  . Diabetes Paternal Grandmother   . Diabetes Paternal Grandfather     Social History Social History  Substance Use Topics  . Smoking status: Never Smoker  . Smokeless tobacco: Not on file  . Alcohol use No     Allergies   Patient has no known allergies.   Review of Systems Review of Systems  Constitutional: Positive for chills and fatigue. Negative for fever.  HENT: Positive for ear pain and sore throat. Negative for congestion, facial swelling, hearing loss, rhinorrhea, tinnitus and trouble swallowing.   Eyes: Negative for visual disturbance.  Respiratory: Negative for cough, shortness of breath and wheezing.   Cardiovascular: Negative for chest pain.  Gastrointestinal: Negative  for abdominal pain, diarrhea, nausea and vomiting.     Physical Exam Updated Vital Signs BP 120/72 (BP Location: Left Arm)   Pulse 96   Temp 98.7 F (37.1 C) (Oral)   Resp 20   Ht 5\' 8"  (1.727 m)   Wt 100.7 kg (222 lb 0.1 oz)   LMP 08/28/2017   SpO2 100%   BMI 33.76 kg/m   Physical Exam  Constitutional: She appears well-developed and well-nourished. No distress.  HENT:  Head: Normocephalic and atraumatic.  Nose: Nose normal.  Dukas membranes moist.  Oropharynx mildly erythematous, no tonsillar exudate.  Uvula midline.  No trismus.  Patient able to handle oral secretions.  No tenderness of the mastoids.  External ear canals  normal.  Bilateral TMs with good cone of light.   Eyes: Pupils are equal, round, and reactive to light. Conjunctivae are normal. Right eye exhibits no discharge. Left eye exhibits no discharge.  Neck: Normal range of motion. Neck supple.  Tender anterior cervical adenopathy.  Cardiovascular: Normal rate and regular rhythm.  Exam reveals no friction rub.   No murmur heard. Pulmonary/Chest: Effort normal and breath sounds normal. No respiratory distress. She has no wheezes. She has no rales.  Neurological: She is alert. Coordination normal.  Skin: She is not diaphoretic.  Psychiatric: She has a normal mood and affect. Her behavior is normal.  Nursing note and vitals reviewed.    ED Treatments / Results  Labs (all labs ordered are listed, but only abnormal results are displayed) Labs Reviewed  RAPID STREP SCREEN ([facility])  CULTURE, GROUP A STREP [facility])    EKG  EKG Interpretation None       Radiology No results found.  Procedures Procedures (including critical care time)  Medications Ordered in ED Medications  acetaminophen (TYLENOL) tablet 650 mg (650 mg Oral Given 09/18/17 2020)     Initial Impression / Assessment and Plan / ED Course  I have reviewed the triage vital signs and the nursing notes.  Pertinent labs & imaging results that were available during my care of the patient were reviewed by me and considered in my medical decision making (see chart for details).     Patient presents with sore throat, bilateral ear pain and chills. Pt  without tonsillar exudate, negative strep. Presents with mild cervical lymphadenopathy, & odynophagia; diagnosis of viral pharyngitis. No abx indicated. Discharged with symptomatic tx for pain. Pt does not appear dehydrated, but did discuss importance of water rehydration. Presentation non concerning for PTA or RPA. No trismus or uvula deviation. Exam of the ear non-concerning for infection. Patient had a mild fever of 100.66F on  arrival, on recheck temperature 98.52F. Specific return precautions discussed including inability to swallow due to throat pain, trouble breathing due to throat swelling, fever with vomiting that does not stop. Pt able to drink water in ED without difficulty with intact air way. Recommended PCP follow up.  Patient voiced understanding to above plan and agrees.   Final Clinical Impressions(s) / ED Diagnoses   Final diagnoses:  Viral upper respiratory illness      Kellie ShropshireShrosbree, Emily J, PA-C 09/19/17 [phone]    Geoffery Lyonselo, Douglas, MD 09/19/17 [phone]

## 2017-09-19 NOTE — ED Notes (Signed)
Pt refused to have vitals taken at d/c home.

## 2017-09-19 NOTE — Discharge Instructions (Signed)
Your strep test was negative today.  No antibiotics are indicated.  Your symptoms are consistent with a viral upper respiratory tract infection.  I have prescribed you lidocaine swish and spit.  This is a numbing medicine which will help with throat pain. Please fill this prescription and use it for symptomatic relief.  Also use salt water gargles, over-the-counter throat lozenges, ibuprofen for your throat pain.  Your ears were not infected on exam today.  Your symptoms may be related to fluid in the ear from allergies.  You can try Loratidine 10mg  daily to help with these symptoms.   I have written you a work note.  Please return to the emergency department if you have throat pain in which you have difficulty breathing or can no longer swallow. Please also return for any new or worsening symptoms.

## 2017-09-21 LAB — CULTURE, GROUP A STREP (THRC)

## 2018-10-15 ENCOUNTER — Other Ambulatory Visit: Payer: Self-pay

## 2018-10-15 ENCOUNTER — Emergency Department (HOSPITAL_BASED_OUTPATIENT_CLINIC_OR_DEPARTMENT_OTHER)
Admission: EM | Admit: 2018-10-15 | Discharge: 2018-10-15 | Disposition: A | Discharge status: Home / Self Care | Payer: BC Managed Care – PPO | Attending: Emergency Medicine | Admitting: Emergency Medicine

## 2018-10-15 ENCOUNTER — Encounter (HOSPITAL_BASED_OUTPATIENT_CLINIC_OR_DEPARTMENT_OTHER): Payer: Self-pay | Admitting: *Deleted

## 2018-10-15 DIAGNOSIS — M25552 Pain in left hip: Secondary | ICD-10-CM | POA: Diagnosis not present

## 2018-10-15 DIAGNOSIS — M549 Dorsalgia, unspecified: Secondary | ICD-10-CM | POA: Insufficient documentation

## 2018-10-15 DIAGNOSIS — M545 Low back pain: Secondary | ICD-10-CM | POA: Diagnosis not present

## 2018-10-15 DIAGNOSIS — Y999 Unspecified external cause status: Secondary | ICD-10-CM | POA: Diagnosis not present

## 2018-10-15 DIAGNOSIS — M542 Cervicalgia: Secondary | ICD-10-CM | POA: Diagnosis present

## 2018-10-15 DIAGNOSIS — M25512 Pain in left shoulder: Secondary | ICD-10-CM | POA: Insufficient documentation

## 2018-10-15 DIAGNOSIS — Y92410 Unspecified street and highway as the place of occurrence of the external cause: Secondary | ICD-10-CM | POA: Insufficient documentation

## 2018-10-15 DIAGNOSIS — Z79899 Other long term (current) drug therapy: Secondary | ICD-10-CM | POA: Insufficient documentation

## 2018-10-15 DIAGNOSIS — M7918 Myalgia, other site: Secondary | ICD-10-CM

## 2018-10-15 DIAGNOSIS — Y9389 Activity, other specified: Secondary | ICD-10-CM | POA: Insufficient documentation

## 2018-10-15 MED ORDER — METHOCARBAMOL 500 MG PO TABS: 500.0000 mg | ORAL_TABLET | Freq: Two times a day (BID) | ORAL | 0 refills | Status: DC
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
## 2019-02-06 ENCOUNTER — Emergency Department (HOSPITAL_BASED_OUTPATIENT_CLINIC_OR_DEPARTMENT_OTHER)
Admission: EM | Admit: 2019-02-06 | Discharge: 2019-02-06 | Disposition: A | Discharge status: Home / Self Care | Payer: BC Managed Care – PPO | Attending: Emergency Medicine | Admitting: Emergency Medicine

## 2019-02-06 ENCOUNTER — Other Ambulatory Visit: Payer: Self-pay

## 2019-02-06 ENCOUNTER — Encounter (HOSPITAL_BASED_OUTPATIENT_CLINIC_OR_DEPARTMENT_OTHER): Payer: Self-pay | Admitting: *Deleted

## 2019-02-06 DIAGNOSIS — M546 Pain in thoracic spine: Secondary | ICD-10-CM | POA: Insufficient documentation

## 2019-02-06 MED ORDER — METHOCARBAMOL 500 MG PO TABS: 500.0000 mg | ORAL_TABLET | Freq: Three times a day (TID) | ORAL | 0 refills | Status: AC | PRN

## 2019-02-06 MED ORDER — IBUPROFEN 800 MG PO TABS: 800.0000 mg | ORAL_TABLET | Freq: Three times a day (TID) | ORAL | 0 refills | Status: AC | PRN
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
## 2019-02-06 NOTE — Discharge Instructions (Signed)

## 2023-02-16 ENCOUNTER — Other Ambulatory Visit: Payer: Self-pay

## 2023-02-16 DIAGNOSIS — Z021 Encounter for pre-employment examination: Secondary | ICD-10-CM

## 2023-02-16 NOTE — Progress Notes (Signed)
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  [facility] Account #:  [id number] [facility] Specimen #:  [id number]  Rapid drug screen results = Negative  AMD

## 2023-11-02 ENCOUNTER — Emergency Department: Payer: 59

## 2023-11-02 ENCOUNTER — Ambulatory Visit
Admission: EM | Admit: 2023-11-02 | Discharge: 2023-11-02 | Disposition: A | Discharge status: Home / Self Care | Payer: 59

## 2023-11-02 ENCOUNTER — Other Ambulatory Visit: Payer: Self-pay

## 2023-11-02 ENCOUNTER — Emergency Department
Admission: EM | Admit: 2023-11-02 | Discharge: 2023-11-02 | Disposition: A | Discharge status: Home / Self Care | Payer: 59 | Attending: Emergency Medicine | Admitting: Emergency Medicine

## 2023-11-02 DIAGNOSIS — R079 Chest pain, unspecified: Secondary | ICD-10-CM

## 2023-11-02 DIAGNOSIS — R110 Nausea: Secondary | ICD-10-CM | POA: Diagnosis not present

## 2023-11-02 DIAGNOSIS — Z5321 Procedure and treatment not carried out due to patient leaving prior to being seen by health care provider: Secondary | ICD-10-CM | POA: Insufficient documentation

## 2023-11-02 DIAGNOSIS — R0602 Shortness of breath: Secondary | ICD-10-CM | POA: Diagnosis not present

## 2023-11-02 LAB — CBC
HCT: 38.6 % (ref 36.0–46.0)
Hemoglobin: 12.6 g/dL (ref 12.0–15.0)
MCH: 31 pg (ref 26.0–34.0)
MCHC: 32.6 g/dL (ref 30.0–36.0)
MCV: 94.8 fL (ref 80.0–100.0)
Platelets: 289 10*3/uL (ref 150–400)
RBC: 4.07 MIL/uL (ref 3.87–5.11)
RDW: 11.9 % (ref 11.5–15.5)
WBC: 6.5 10*3/uL (ref 4.0–10.5)
nRBC: 0 % (ref 0.0–0.2)

## 2023-11-02 LAB — BASIC METABOLIC PANEL
Anion gap: 11 (ref 5–15)
BUN: 12 mg/dL (ref 6–20)
CO2: 23 mmol/L (ref 22–32)
Calcium: 9.1 mg/dL (ref 8.9–10.3)
Chloride: 102 mmol/L (ref 98–111)
Creatinine, Ser: 0.85 mg/dL (ref 0.44–1.00)
GFR, Estimated: >60 mL/min (ref >60)
Glucose, Bld: 88 mg/dL (ref 70–99)
Potassium: 3.8 mmol/L (ref 3.5–5.1)
Sodium: 136 mmol/L (ref 135–145)

## 2023-11-02 LAB — TROPONIN I (HIGH SENSITIVITY): Troponin I (High Sensitivity): <2 ng/L (ref <18)
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
## 2023-11-02 NOTE — ED Triage Notes (Signed)
Pt to ED from UC for chest pain started Monday. +shob. +nausea.

## 2023-11-02 NOTE — ED Notes (Signed)
Triaged with provider in room. Patient denies respiratory symptoms today. States she did have respiratory symptoms on thanksgiving. States she has been stressed. Per provider recommendations she wants patient to follow-up in the ED for further work-up. Patient verbalized understanding.

## 2023-11-03 ENCOUNTER — Ambulatory Visit
Admission: EM | Admit: 2023-11-03 | Discharge: 2023-11-03 | Disposition: A | Discharge status: Home / Self Care | Payer: 59 | Attending: Emergency Medicine | Admitting: Emergency Medicine

## 2023-11-03 DIAGNOSIS — J4521 Mild intermittent asthma with (acute) exacerbation: Secondary | ICD-10-CM

## 2023-11-03 MED ORDER — ALBUTEROL SULFATE (2.5 MG/3ML) 0.083% IN NEBU
2.5000 mg | INHALATION_SOLUTION | Freq: Once | RESPIRATORY_TRACT | Status: AC
  Administered 2023-11-03: 2.5 mg via RESPIRATORY_TRACT

## 2023-11-03 MED ORDER — ALBUTEROL SULFATE HFA 108 (90 BASE) MCG/ACT IN AERS: 2.0000 | INHALATION_SPRAY | Freq: Four times a day (QID) | RESPIRATORY_TRACT | 2 refills | Status: AC | PRN

## 2023-11-03 NOTE — ED Provider Notes (Signed)
Renaldo Fiddler    CSN: [id number] Arrival date & time: 11/03/23  3086    HISTORY   Chief Complaint  Patient presents with   Nasal Congestion   Headache   HPI Margaret Russell is a pleasant, 32 y.o. female who presents to urgent care today. Patient complains of headache, chest tightness, fatigue, bilateral ear pain and nasal congestion x 3 days.  Patient states has been taking Mucinex without relief of any of her symptoms.  Patient has normal vital signs on arrival today.  Of note, patient came to this location yesterday complaining of chest tightness feeling as if she had been hit by a truck, she was advised by the provider to go to the emergency room, states that she did not have time to go to the ED yesterday.  Patient reports a remote history of asthma as a child, denies having exacerbations as an adult.  The history is provided by the patient.   Past Medical History:  Diagnosis Date   GERD (gastroesophageal reflux disease)    Patient Active Problem List   Diagnosis Date Noted   Ventral incisional hernia 12/14/2016   Encounter for postoperative wound care 07/01/2016   Cesarean wound infection 06/30/2016   Cellulitis and abscess of trunk 06/21/2016   S/P primary low transverse C-section 06/10/2016   Morbid obesity with BMI of 40.0-44.9, adult (HCC) 06/09/2016   Anxiety and depression 02/12/2016   Obesity affecting pregnancy 02/12/2016   History of stillbirth in currently pregnant patient 10/30/2015   Decreased fetal movement in pregnancy    [redacted] weeks gestation of pregnancy    Postpartum care following vaginal delivery 11/22/2014   IUFD (intrauterine fetal death) 12/16/2014   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 Authorizing Provider  ibuprofen (ADVIL,MOTRIN) 800 MG tablet Take 1 tablet (800 mg total) by mouth every 8 (eight) hours as needed for moderate pain. 02/06/19   Long, Arlyss Repress, MD  lidocaine (XYLOCAINE) 2 % solution Use as directed 15 mLs in the mouth or throat as needed for mouth pain. 09/19/17   Kellie Shropshire, PA-C  loratadine (CLARITIN) 10 MG tablet Take 1 tablet (10 mg total) by mouth daily. 09/19/17   Kellie Shropshire, PA-C  methocarbamol (ROBAXIN) 500 MG tablet Take 1 tablet (500 mg total) by mouth every 8 (eight) hours as needed for muscle spasms. 02/06/19   Long, Arlyss Repress, MD  omeprazole (PRILOSEC) 20 MG capsule Take 20 mg by mouth daily.    [provider]  ondansetron (ZOFRAN) 4 MG tablet Take 1 tablet (4 mg total) by mouth every 6 (six) hours. 12/09/15   Marny Lowenstein, PA-C  Prenatal Vit-Fe Fumarate-FA (PRENATAL MULTIVITAMIN) TABS tablet Take 1 tablet by mouth daily at 12 noon.    [provider]    Family History Family History  Problem  Relation Age of Onset   Diabetes Paternal Grandmother    Diabetes Paternal Grandfather    Social History Social History   Tobacco Use   Smoking status: Never   Smokeless tobacco: Never  Vaping Use   Vaping status: Never Used  Substance Use Topics   Alcohol use: No    Alcohol/week: 0.0 standard drinks of alcohol   Drug use: No   Allergies   Patient has no known allergies.  Review of Systems Review of Systems Pertinent findings revealed after performing a 14 point review of systems has been noted in the history of present illness.  Physical Exam Vital Signs BP 109/74 (BP Location: Left Arm)   Pulse 64   Temp 98.9 F (37.2 C) (Oral)   Resp 16   LMP  (LMP Unknown)   SpO2 98%   No data found.  Physical Exam Vitals and nursing note reviewed.  Constitutional:      General: She is not  in acute distress.    Appearance: Normal appearance. She is not ill-appearing.  HENT:     Head: Normocephalic and atraumatic.     Salivary Glands: Right salivary gland is not diffusely enlarged or tender. Left salivary gland is not diffusely enlarged or tender.     Right Ear: Tympanic membrane, ear canal and external ear normal. No drainage. No middle ear effusion. There is no impacted cerumen. Tympanic membrane is not erythematous or bulging.     Left Ear: Tympanic membrane, ear canal and external ear normal. No drainage.  No middle ear effusion. There is no impacted cerumen. Tympanic membrane is not erythematous or bulging.     Nose: Nose normal. No nasal deformity, septal deviation, mucosal edema, congestion or rhinorrhea.     Right Turbinates: Not enlarged, swollen or pale.     Left Turbinates: Not enlarged, swollen or pale.     Right Sinus: No maxillary sinus tenderness or frontal sinus tenderness.     Left Sinus: No maxillary sinus tenderness or frontal sinus tenderness.     Mouth/Throat:     Lips: Pink. No lesions.     Mouth: Mucous membranes are moist. No oral lesions.     Pharynx: Oropharynx is clear. Uvula midline. No posterior oropharyngeal erythema or uvula swelling.     Tonsils: No tonsillar exudate. 0 on the right. 0 on the left.  Eyes:     General: Lids are normal.        Right eye: No discharge.        Left eye: No discharge.     Extraocular Movements: Extraocular movements intact.     Conjunctiva/sclera: Conjunctivae normal.     Right eye: Right conjunctiva is not injected.     Left eye: Left conjunctiva is not injected.  Neck:     Trachea: Trachea and phonation normal.  Cardiovascular:     Rate and Rhythm: Normal rate and regular rhythm.     Pulses: Normal pulses.     Heart sounds: Normal heart sounds. No murmur heard.    No friction rub. No gallop.  Pulmonary:     Effort: Pulmonary effort is normal. No tachypnea, bradypnea, accessory muscle usage, prolonged  expiration or respiratory distress.     Breath sounds: Normal air entry. No stridor, decreased air movement or transmitted upper airway sounds. Examination of the right-upper field reveals decreased breath sounds. Examination of the left-upper field reveals decreased breath sounds. Examination of the right-middle field reveals decreased breath sounds. Examination of the left-middle field  reveals decreased breath sounds. Examination of the right-lower field reveals decreased breath sounds. Examination of the left-lower field reveals decreased breath sounds. Decreased breath sounds present. No wheezing, rhonchi or rales.  Chest:     Chest wall: No tenderness.  Musculoskeletal:        General: Normal range of motion.     Cervical back: Normal range of motion and neck supple. Normal range of motion.  Lymphadenopathy:     Cervical: No cervical adenopathy.  Skin:    General: Skin is warm and dry.     Findings: No erythema or rash.  Neurological:     General: No focal deficit present.     Mental Status: She is alert and oriented to person, place, and time.  Psychiatric:        Mood and Affect: Mood normal.        Behavior: Behavior normal.     Visual Acuity Right Eye Distance:   Left Eye Distance:   Bilateral Distance:    Right Eye Near:   Left Eye Near:    Bilateral Near:     UC Couse / Diagnostics / Procedures:     Radiology   Procedures Procedures (including critical care time) EKG  Pending results:  Labs Reviewed - No data to display  Medications Ordered in UC: Medications  albuterol (PROVENTIL) (2.5 MG/3ML) 0.083% nebulizer solution 2.5 mg (has no administration in time range)    UC Diagnoses / Final Clinical Impressions(s)   I have reviewed the triage vital signs and the nursing notes.  Pertinent labs & imaging results that were available during my care of the patient were reviewed by me and considered in my medical decision making (see chart for details).    Final  diagnoses:  Mild intermittent reactive airway disease with acute exacerbation   Repeat auscultation post nebulized bronchodilator revealed patient reported improvement of work of breathing and significantly improved breath sounds throughout without wheeze, rale or rhonchi.  Recommend patient begin albuterol 2 puffs 3-4 times daily as needed for tightness of chest and coughing.  Conservative care recommended.  Return precautions advised.  Please see discharge instructions below for details of plan of care as provided to patient. ED Prescriptions     Medication Sig Dispense Auth. Provider   albuterol (VENTOLIN HFA) 108 (90 Base) MCG/ACT inhaler Inhale 2 puffs into the lungs every 6 (six) hours as needed for wheezing or shortness of breath (Cough). 18 g Theadora Rama Scales, PA-C      PDMP not reviewed this encounter.  Pending results:  Labs Reviewed - No data to display    Discharge Instructions      Your symptoms and my physical exam findings are concerning for reactive airway disease with an acute exacerbation.  Reactive airways disease is when your lower airway becomes inflamed due to viral infection, bacterial infection or allergy exposure.   Please see the list below for recommended medications, dosages and frequencies to provide relief of your current symptoms:     ProAir, Ventolin, Proventil (albuterol): This inhaled medication contains a short acting beta agonist bronchodilator.  This medication works on the smooth muscle that opens and constricts of your airways by relaxing the muscle.  The result of relaxation of the smooth muscle is increased air movement and improved work of breathing.  This is a short acting medication that can be used every 4-6 hours as needed for increased work of breathing, shortness of breath, wheezing and excessive coughing.  Please follow-up within the next 5-7 days either with your primary care provider or urgent care if your symptoms do  meaningfully improve.  If you do not have a primary care provider, we will assist you in finding one.   Thank you for visiting [facility] today.  We appreciate the opportunity to participate in your care.       Disposition Upon Discharge:  Condition: stable for discharge home  Patient presented with an acute illness with associated systemic symptoms and significant discomfort requiring urgent management. In my opinion, this is a condition that a prudent lay person (someone who possesses an average knowledge of health and medicine) may potentially expect to result in complications if not addressed urgently such as respiratory distress, impairment of bodily function or dysfunction of bodily organs.   Routine symptom specific, illness specific and/or disease specific instructions were discussed with the patient and/or caregiver at length.   As such, the patient has been evaluated and assessed, work-up was performed and treatment was provided in alignment with urgent care protocols and evidence based medicine.  Patient/parent/caregiver has been advised that the patient may require follow up for further testing and treatment if the symptoms continue in spite of treatment, as clinically indicated and appropriate.  Patient/parent/caregiver has been advised to return to the [facility] or PCP if no better; to PCP or the Emergency Department if new signs and symptoms develop, or if the current signs or symptoms continue to change or worsen for further workup, evaluation and treatment as clinically indicated and appropriate  The patient will follow up with their current PCP if and as advised. If the patient does not currently have a PCP we will assist them in obtaining one.   The patient may need specialty follow up if the symptoms continue, in spite of conservative treatment and management, for further workup, evaluation, consultation and treatment as clinically indicated and  appropriate.  Patient/parent/caregiver verbalized understanding and agreement of plan as discussed.  All questions were addressed during visit.  Please see discharge instructions below for further details of plan.  This office note has been dictated using Teaching laboratory technician.  Unfortunately, this method of dictation can sometimes lead to typographical or grammatical errors.  I apologize for your inconvenience in advance if this occurs.  Please do not hesitate to reach out to me if clarification is needed.      Theadora Rama Scales, [place] 11/03/23 [phone]

## 2023-11-03 NOTE — ED Triage Notes (Signed)
Pt states she is having headache, chest tightness, fatigue, ear pain, congestion x 3 days. Taking mucinex with no relief of symptoms.

## 2023-11-03 NOTE — Discharge Instructions (Signed)
Your symptoms and my physical exam findings are concerning for reactive airway disease with an acute exacerbation.  Reactive airways disease is when your lower airway becomes inflamed due to viral infection, bacterial infection or allergy exposure.   Please see the list below for recommended medications, dosages and frequencies to provide relief of your current symptoms:     ProAir, Ventolin, Proventil (albuterol): This inhaled medication contains a short acting beta agonist bronchodilator.  This medication works on the smooth muscle that opens and constricts of your airways by relaxing the muscle.  The result of relaxation of the smooth muscle is increased air movement and improved work of breathing.  This is a short acting medication that can be used every 4-6 hours as needed for increased work of breathing, shortness of breath, wheezing and excessive coughing.     Please follow-up within the next 5-7 days either with your primary care provider or urgent care if your symptoms do meaningfully improve.  If you do not have a primary care provider, we will assist you in finding one.   Thank you for visiting [facility] today.  We appreciate the opportunity to participate in your care.

## 2024-06-18 ENCOUNTER — Other Ambulatory Visit: Payer: Self-pay

## 2024-06-18 ENCOUNTER — Emergency Department
Admission: EM | Admit: 2024-06-18 | Discharge: 2024-06-18 | Disposition: A | Discharge status: Home / Self Care | Attending: Emergency Medicine | Admitting: Emergency Medicine

## 2024-06-18 DIAGNOSIS — M79644 Pain in right finger(s): Secondary | ICD-10-CM | POA: Diagnosis present

## 2024-06-18 DIAGNOSIS — L03011 Cellulitis of right finger: Secondary | ICD-10-CM | POA: Diagnosis not present

## 2024-06-18 MED ORDER — DOXYCYCLINE HYCLATE 100 MG PO CAPS: 100.0000 mg | ORAL_CAPSULE | Freq: Two times a day (BID) | ORAL | 0 refills | Status: AC

## 2024-06-18 MED ORDER — CEFADROXIL 500 MG PO CAPS: 500.0000 mg | ORAL_CAPSULE | Freq: Two times a day (BID) | ORAL | 0 refills | Status: AC

## 2024-06-18 MED ORDER — CEPHALEXIN 500 MG PO CAPS
500.0000 mg | ORAL_CAPSULE | Freq: Once | ORAL | Status: AC
  Administered 2024-06-18: 500 mg via ORAL
  Filled 2024-06-18: qty 1

## 2024-06-18 MED ORDER — LIDOCAINE HCL (PF) 1 % IJ SOLN
5.0000 mL | Freq: Once | INTRAMUSCULAR | Status: AC
  Administered 2024-06-18: 5 mL
  Filled 2024-06-18: qty 5

## 2024-06-18 MED ORDER — DOXYCYCLINE HYCLATE 100 MG PO CAPS: 100.0000 mg | ORAL_CAPSULE | Freq: Two times a day (BID) | ORAL | 0 refills | Status: DC

## 2024-06-18 MED ORDER — DOXYCYCLINE HYCLATE 100 MG PO TABS
100.0000 mg | ORAL_TABLET | Freq: Once | ORAL | Status: AC
  Administered 2024-06-18: 100 mg via ORAL
  Filled 2024-06-18: qty 1

## 2024-06-18 MED ORDER — CEFADROXIL 500 MG PO CAPS: 500.0000 mg | ORAL_CAPSULE | Freq: Two times a day (BID) | ORAL | 0 refills | Status: DC

## 2024-06-18 NOTE — ED Provider Notes (Signed)
 [facility] Provider Note    Event Date/Time   First MD Initiated Contact with Patient 06/18/24 0402     (approximate)   History   Hand Pain   HPI Margaret Russell is a 33 y.o. female who presents for evaluation of right index finger pain.  She has long acrylic nails in place.  She said that about a week ago she had one of her nails on and she banged the tip of the nail onto a hard surface and thinks she might of lifted up her natural nail from the nailbed.  It hurt a little bit at the time but it was okay, but it is gradually gotten worse and worse.  A few days ago she had the acrylic nail redone and the nail person said it was okay to proceed, but since then the patient feels like she has had substantially increased swelling and pain of the fingertip and she cannot get the nail off to see what is beneath it.  She is hurts the worse on the right side of the fingernail but also radiates around to the fingertip     Physical Exam   Triage Vital Signs: ED Triage Vitals [06/18/24 0347]  Encounter Vitals Group     BP 122/89     Girls Systolic BP Percentile      Girls Diastolic BP Percentile      Boys Systolic BP Percentile      Boys Diastolic BP Percentile      Pulse Rate 73     Resp 16     Temp 98.1 F (36.7 C)     Temp Source Oral     SpO2 99 %     Weight 90.7 kg (200 lb)     Height 1.727 m (5' 8)     Head Circumference      Peak Flow      Pain Score 10     Pain Loc      Pain Education      Exclude from Growth Chart     Most recent vital signs: Vitals:   06/18/24 0347  BP: 122/89  Pulse: 73  Resp: 16  Temp: 98.1 F (36.7 C)  SpO2: 99%    General: Awake, no obvious distress, generally appears well. CV:  Good peripheral perfusion.  Resp:  Normal effort. Speaking easily and comfortably, no accessory muscle usage nor intercostal retractions.   Abd:  No distention.  Other:  Acrylic nail in place.  Most of the tenderness is on the right  (middle finger side) of the right index finger and radiates around to the fingertip but the tip is not substantially swollen or erythematous suggestive of a felon.  Not obviously erythematous.  Cannot visualize the nail or nailbed at this time.   ED Results / Procedures / Treatments   Labs (all labs ordered are listed, but only abnormal results are displayed) Labs Reviewed - No data to display    PROCEDURES:  Critical Care performed: No  .Incision and Drainage  Date/Time: 06/18/2024 6:30 AM  Performed by: Gordan Huxley, MD Authorized by: Gordan Huxley, MD   Consent:    Consent obtained:  Verbal   Consent given by:  Patient   Risks discussed:  Bleeding, pain, incomplete drainage and infection   Alternatives discussed:  No treatment, delayed treatment and alternative treatment Universal protocol:    Patient identity confirmed:  Verbally with patient Location:    Indications for incision and drainage: Paronychia.  Location:  Upper extremity   Upper extremity location:  Finger   Finger location:  R index finger Pre-procedure details:    Procedure prep: alcohol. Anesthesia:    Anesthesia method:  Nerve block   Block location:  Right index finger middle phalanx   Block needle gauge:  25 G   Block anesthetic:  Lidocaine  1% w/o epi   Block technique:  Digital block   Block injection procedure:  Anatomic landmarks identified, anatomic landmarks palpated, introduced needle, negative aspiration for blood and incremental injection   Block outcome:  Anesthesia achieved Procedure type:    Complexity:  Simple Procedure details:    Incision types:  Stab incision   Drainage:  Bloody   Drainage amount:  Scant   Wound treatment:  Wound left open   Packing materials:  None Post-procedure details:    Procedure completion:  Tolerated well, no immediate complications     IMPRESSION / MDM / ASSESSMENT AND PLAN / ED COURSE  I reviewed the triage vital signs and the nursing notes.                               Differential diagnosis includes, but is not limited to, paronychia, nailbed injury, subungual hematoma, felon.  Patient's presentation is most consistent with acute, uncomplicated illness.   Interventions/Medications given:  Medications  doxycycline  (VIBRA -TABS) tablet 100 mg (has no administration in time range)  cephALEXin  (KEFLEX ) capsule 500 mg (has no administration in time range)  lidocaine  (PF) (XYLOCAINE ) 1 % injection 5 mL (5 mLs Other Given 06/18/24 9371)    (Note:  hospital course my include additional interventions and/or labs/studies not listed above.)   Probable paronychia, staff working with the patient to try and remove the acrylic nail and then I will reassess possible need for I&D.     Clinical Course as of 06/18/24 9361  Mon Jun 18, 2024  9367 Many attempts were made to move the acrylic nail.  Much of it was removed but we were unable to completely remove the acrylic from the base natural nail.  However it was enough that I was able to see that there is no subungual hematoma.  Exam and history was consistent with the possibility of paronychia.  I talked with the patient and we decided to proceed with a digital block to give the patient some relief while trying remove the nail and then I I&D'd around the nail without a copious drainage.  I talked with her about doing finger soaks, milking the finger, and we are starting her on empiric doxycycline  and cefadroxil  to cover staph and strep species.  I am giving her follow-up information with a hand specialist and I gave strict return precautions.  She says she understands and agrees with the plan. [CF]    Clinical Course User Index [CF] Gordan Huxley, MD     FINAL CLINICAL IMPRESSION(S) / ED DIAGNOSES   Final diagnoses:  Paronychia of finger, right     Rx / DC Orders   ED Discharge Orders          Ordered    cefadroxil  (DURICEF) 500 MG capsule  2 times daily        06/18/24 0638     doxycycline  (VIBRAMYCIN ) 100 MG capsule  2 times daily        06/18/24 9361             Note:  This document was prepared  using Conservation officer, historic buildings and may include unintentional dictation errors.   Gordan Huxley, MD 06/18/24 [id number]

## 2024-06-18 NOTE — Discharge Instructions (Addendum)
 Please soak your finger 2 or 3 times a day in warm water and take the full course of both antibiotics as prescribed.  Do not use any additional artificial nails.  We recommend you call the office of Dr. Carlos and schedule a follow-up appointment with her or one of her colleagues.      Return to the emergency department if you develop new or worsening symptoms that concern you.

## 2024-06-18 NOTE — ED Notes (Signed)
 Pt states she had hit her nail of her right pointer finger against the wall a week prior and believed that her nail might have lifted but did not think much of it. The pt states she then went to go get her nails done(acrylic nails) and noticed last night that her finger was red and swollen and her nail hurt to the touch.  Pt denies any drainage coming form the finger at this time. Finger is noted to have some mild redness and swelling.

## 2024-06-18 NOTE — ED Triage Notes (Signed)
 Pt presents via POV c/o right index finger pain. Reports possible infection. Denies drainage. Reports swollen and warm.

## 2024-08-16 ENCOUNTER — Ambulatory Visit: Admitting: Sports Medicine
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
## 2024-09-07 ENCOUNTER — Ambulatory Visit

## 2024-09-07 ENCOUNTER — Encounter: Payer: Self-pay | Admitting: Family Medicine

## 2024-09-07 ENCOUNTER — Ambulatory Visit: Admitting: Family Medicine

## 2024-09-07 ENCOUNTER — Other Ambulatory Visit: Payer: Self-pay

## 2024-09-07 VITALS — BP 112/74 | HR 75 | Ht 68.0 in

## 2024-09-07 DIAGNOSIS — M222X1 Patellofemoral disorders, right knee: Secondary | ICD-10-CM | POA: Insufficient documentation

## 2024-09-07 DIAGNOSIS — M25561 Pain in right knee: Secondary | ICD-10-CM

## 2024-09-07 NOTE — Assessment & Plan Note (Signed)
 Patellofemoral syndrome noted.  Discussed VMO strengthening, Tru pull lite brace given to work out in.  Discussed icing regimen and home exercises.  Worsening pain consider formal physical therapy and/or injections but think it would be highly unlikely.  Follow-up again in 2 months

## 2024-09-07 NOTE — Patient Instructions (Addendum)
 Xray  Exercises Tru pull lite Ice after activity No deep squats or forward lunges See me in 2 months

## 2024-11-05 NOTE — Progress Notes (Deleted)
 [facility] [address] [place] [address] Phone: [phone] Subjective:    I'm seeing this patient by the request  of:  Patient, No Pcp Per  CC:   YEP:Dlagzrupcz  09/07/2024 Patellofemoral syndrome noted.  Discussed VMO strengthening, Tru pull lite brace given to work out in.  Discussed icing regimen and home exercises.  Worsening pain consider formal physical therapy and/or injections but think it would be highly unlikely.  Follow-up again in 2 months     Updated 11/06/2024 Margaret Russell is a 33 y.o. female coming in with complaint of knee pain       Past Medical History:  Diagnosis Date   GERD (gastroesophageal reflux disease)    Past Surgical History:  Procedure Laterality Date   COLPOSCOPY  05/07/14   HERNIA REPAIR     Social History   Socioeconomic History   Marital status: Single    Spouse name: Not on file   Number of children: Not on file   Years of education: Not on file   Highest education level: Not on file  Occupational History   Not on file  Tobacco Use   Smoking status: Never   Smokeless tobacco: Never  Vaping Use   Vaping status: Never Used  Substance and Sexual Activity   Alcohol use: No    Alcohol/week: 0.0 standard drinks of alcohol   Drug use: No   Sexual activity: Not Currently    Partners: Male    Birth control/protection: I.U.D.  Other Topics Concern   Not on file  Social History Narrative   Not on file   Social Drivers of Health   Tobacco Use: Low Risk (09/07/2024)   Patient History    Smoking Tobacco Use: Never    Smokeless Tobacco Use: Never    Passive Exposure: Not on file  Financial Resource Strain: Not on file  Food Insecurity: Not on file  Transportation Needs: Not on file  Physical Activity: Not on file  Stress: Not on file  Social Connections: Not on file  Depression (EYV7-0): Not on file  Alcohol Screen: Not on file  Housing: Not on file  Utilities: Not on file   Health Literacy: Not on file   Allergies[1] Family History  Problem Relation Age of Onset   Diabetes Paternal Grandmother    Diabetes Paternal Grandfather     Current Outpatient Medications (Respiratory):    albuterol  (VENTOLIN  HFA) 108 (90 Base) MCG/ACT inhaler, Inhale 2 puffs into the lungs every 6 (six) hours as needed for wheezing or shortness of breath (Cough).   loratadine  (CLARITIN ) 10 MG tablet, Take 1 tablet (10 mg total) by mouth daily.  Current Outpatient Medications (Analgesics):    ibuprofen  (ADVIL ,MOTRIN ) 800 MG tablet, Take 1 tablet (800 mg total) by mouth every 8 (eight) hours as needed for moderate pain.  Current Outpatient Medications (Other):    lidocaine  (XYLOCAINE ) 2 % solution, Use as directed 15 mLs in the mouth or throat as needed for mouth pain.   methocarbamol  (ROBAXIN ) 500 MG tablet, Take 1 tablet (500 mg total) by mouth every 8 (eight) hours as needed for muscle spasms.   omeprazole (PRILOSEC) 20 MG capsule, Take 20 mg by mouth daily.   ondansetron  (ZOFRAN ) 4 MG tablet, Take 1 tablet (4 mg total) by mouth every 6 (six) hours.   Prenatal Vit-Fe Fumarate-FA (PRENATAL MULTIVITAMIN) TABS tablet, Take 1 tablet by mouth daily at 12 noon.   Reviewed prior external information including notes and imaging  from  primary care provider As well as notes that were available from care everywhere and other healthcare systems.  Past medical history, social, surgical and family history all reviewed in electronic medical record.  No pertanent information unless stated regarding to the chief complaint.   Review of Systems:  No headache, visual changes, nausea, vomiting, diarrhea, constipation, dizziness, abdominal pain, skin rash, fevers, chills, night sweats, weight loss, swollen lymph nodes, body aches, joint swelling, chest pain, shortness of breath, mood changes. POSITIVE muscle aches  Objective  There were no vitals taken for this visit.   General: No apparent  distress alert and oriented x3 mood and affect normal, dressed appropriately.  HEENT: Pupils equal, extraocular movements intact  Respiratory: Patient's speak in full sentences and does not appear short of breath  Cardiovascular: No lower extremity edema, non tender, no erythema      Impression and Recommendations:           [1] No Known Allergies

## 2024-11-06 ENCOUNTER — Ambulatory Visit: Admitting: Family Medicine

## 2024-12-21 NOTE — Progress Notes (Unsigned)
 " [name] [address] [place] [address] Phone: [phone] Subjective:    I'm seeing this patient by the request  of:  Patient, No Pcp Per  CC:   YEP:Dlagzrupcz  09/07/2024 Patellofemoral syndrome noted.  Discussed VMO strengthening, Tru pull lite brace given to work out in.  Discussed icing regimen and home exercises.  Worsening pain consider formal physical therapy and/or injections but think it would be highly unlikely.  Follow-up again in 2 months     Updated 12/25/2024 Margaret Russell is a 34 y.o. female coming in with complaint of knee pain       Past Medical History:  Diagnosis Date   GERD (gastroesophageal reflux disease)    Past Surgical History:  Procedure Laterality Date   COLPOSCOPY  05/07/14   HERNIA REPAIR     Social History   Socioeconomic History   Marital status: Single    Spouse name: Not on file   Number of children: Not on file   Years of education: Not on file   Highest education level: Not on file  Occupational History   Not on file  Tobacco Use   Smoking status: Never   Smokeless tobacco: Never  Vaping Use   Vaping status: Never Used  Substance and Sexual Activity   Alcohol use: No    Alcohol/week: 0.0 standard drinks of alcohol   Drug use: No   Sexual activity: Not Currently    Partners: Male    Birth control/protection: I.U.D.  Other Topics Concern   Not on file  Social History Narrative   Not on file   Social Drivers of Health   Tobacco Use: Low Risk (09/07/2024)   Patient History    Smoking Tobacco Use: Never    Smokeless Tobacco Use: Never    Passive Exposure: Not on file  Financial Resource Strain: Not on file  Food Insecurity: Not on file  Transportation Needs: Not on file  Physical Activity: Not on file  Stress: Not on file  Social Connections: Not on file  Depression (EYV7-0): Not on file  Alcohol Screen: Not on file  Housing: Not on file  Utilities: Not on file  Health  Literacy: Not on file   Allergies[1] Family History  Problem Relation Age of Onset   Diabetes Paternal Grandmother    Diabetes Paternal Grandfather     Current Outpatient Medications (Respiratory):    albuterol  (VENTOLIN  HFA) 108 (90 Base) MCG/ACT inhaler, Inhale 2 puffs into the lungs every 6 (six) hours as needed for wheezing or shortness of breath (Cough).   loratadine  (CLARITIN ) 10 MG tablet, Take 1 tablet (10 mg total) by mouth daily.  Current Outpatient Medications (Analgesics):    ibuprofen  (ADVIL ,MOTRIN ) 800 MG tablet, Take 1 tablet (800 mg total) by mouth every 8 (eight) hours as needed for moderate pain.  Current Outpatient Medications (Other):    lidocaine  (XYLOCAINE ) 2 % solution, Use as directed 15 mLs in the mouth or throat as needed for mouth pain.   methocarbamol  (ROBAXIN ) 500 MG tablet, Take 1 tablet (500 mg total) by mouth every 8 (eight) hours as needed for muscle spasms.   omeprazole (PRILOSEC) 20 MG capsule, Take 20 mg by mouth daily.   ondansetron  (ZOFRAN ) 4 MG tablet, Take 1 tablet (4 mg total) by mouth every 6 (six) hours.   Prenatal Vit-Fe Fumarate-FA (PRENATAL MULTIVITAMIN) TABS tablet, Take 1 tablet by mouth daily at 12 noon.   Reviewed prior external information including notes and  imaging from  primary care provider As well as notes that were available from care everywhere and other healthcare systems.  Past medical history, social, surgical and family history all reviewed in electronic medical record.  No pertanent information unless stated regarding to the chief complaint.   Review of Systems:  No headache, visual changes, nausea, vomiting, diarrhea, constipation, dizziness, abdominal pain, skin rash, fevers, chills, night sweats, weight loss, swollen lymph nodes, body aches, joint swelling, chest pain, shortness of breath, mood changes. POSITIVE muscle aches  Objective  There were no vitals taken for this visit.   General: No apparent distress alert  and oriented x3 mood and affect normal, dressed appropriately.  HEENT: Pupils equal, extraocular movements intact  Respiratory: Patient's speak in full sentences and does not appear short of breath  Cardiovascular: No lower extremity edema, non tender, no erythema      Impression and Recommendations:           [1] No Known Allergies  "

## 2024-12-25 ENCOUNTER — Ambulatory Visit: Admitting: Family Medicine
